# Patient Record
Sex: Female | Born: 1950 | Race: Asian | Hispanic: No | Marital: Married | Smoking: Never smoker
Health system: Southern US, Community
[De-identification: ages and names within clinical notes are randomized; demographics above are authoritative.]

## PROBLEM LIST (undated history)

## (undated) DIAGNOSIS — E119 Type 2 diabetes mellitus without complications: Secondary | ICD-10-CM

## (undated) DIAGNOSIS — E785 Hyperlipidemia, unspecified: Secondary | ICD-10-CM

## (undated) DIAGNOSIS — M1990 Unspecified osteoarthritis, unspecified site: Secondary | ICD-10-CM

## (undated) DIAGNOSIS — I10 Essential (primary) hypertension: Secondary | ICD-10-CM

## (undated) HISTORY — DX: Hyperlipidemia, unspecified: E78.5

## (undated) HISTORY — DX: Unspecified osteoarthritis, unspecified site: M19.90

---

## 2007-08-25 ENCOUNTER — Ambulatory Visit: Payer: Self-pay | Admitting: Internal Medicine

## 2007-10-06 ENCOUNTER — Encounter: Payer: Self-pay | Admitting: Family Medicine

## 2007-10-06 ENCOUNTER — Ambulatory Visit: Payer: Self-pay | Admitting: Internal Medicine

## 2007-10-06 LAB — CONVERTED CEMR LAB
ALT: 25 units/L (ref 0–35)
Alkaline Phosphatase: 94 units/L (ref 39–117)
Basophils Absolute: 0.1 10*3/uL (ref 0.0–0.1)
Basophils Relative: 1 % (ref 0–1)
CO2: 25 meq/L (ref 19–32)
Cholesterol: 198 mg/dL (ref 0–200)
Creatinine, Ser: 0.96 mg/dL (ref 0.40–1.20)
Eosinophils Absolute: 1.8 10*3/uL — ABNORMAL HIGH (ref 0.0–0.7)
Eosinophils Relative: 18 % — ABNORMAL HIGH (ref 0–5)
HCT: 40.2 % (ref 36.0–46.0)
Hemoglobin: 12.8 g/dL (ref 12.0–15.0)
Hepatitis B Surface Ag: NEGATIVE
LDL Cholesterol: 116 mg/dL — ABNORMAL HIGH (ref 0–99)
Lymphocytes Relative: 39 % (ref 12–46)
MCHC: 31.8 g/dL (ref 30.0–36.0)
MCV: 76.1 fL — ABNORMAL LOW (ref 78.0–100.0)
Monocytes Absolute: 0.4 10*3/uL (ref 0.1–1.0)
RDW: 15.2 % (ref 11.5–15.5)
Total Bilirubin: 0.4 mg/dL (ref 0.3–1.2)
Total CHOL/HDL Ratio: 5.4
VLDL: 45 mg/dL — ABNORMAL HIGH (ref 0–40)

## 2007-11-28 ENCOUNTER — Ambulatory Visit: Payer: Self-pay | Admitting: Internal Medicine

## 2008-08-13 ENCOUNTER — Ambulatory Visit: Payer: Self-pay | Admitting: Internal Medicine

## 2008-10-06 ENCOUNTER — Ambulatory Visit: Payer: Self-pay | Admitting: Internal Medicine

## 2008-10-06 ENCOUNTER — Encounter: Payer: Self-pay | Admitting: Family Medicine

## 2008-10-06 LAB — CONVERTED CEMR LAB
Albumin: 4.1 g/dL (ref 3.5–5.2)
Alkaline Phosphatase: 90 units/L (ref 39–117)
BUN: 16 mg/dL (ref 6–23)
Calcium: 9.6 mg/dL (ref 8.4–10.5)
Chloride: 105 meq/L (ref 96–112)
Glucose, Bld: 109 mg/dL — ABNORMAL HIGH (ref 70–99)
HDL: 33 mg/dL — ABNORMAL LOW (ref >39)
Potassium: 4.5 meq/L (ref 3.5–5.3)
Triglycerides: 177 mg/dL — ABNORMAL HIGH (ref <150)

## 2008-10-13 ENCOUNTER — Other Ambulatory Visit: Admission: RE | Admit: 2008-10-13 | Discharge: 2008-10-13 | Payer: Self-pay | Admitting: Internal Medicine

## 2008-10-13 ENCOUNTER — Ambulatory Visit: Payer: Self-pay | Admitting: Internal Medicine

## 2008-10-13 ENCOUNTER — Encounter: Payer: Self-pay | Admitting: Family Medicine

## 2008-10-14 ENCOUNTER — Ambulatory Visit (HOSPITAL_COMMUNITY): Admission: RE | Admit: 2008-10-14 | Discharge: 2008-10-14 | Payer: Self-pay | Admitting: Family Medicine

## 2008-12-28 ENCOUNTER — Ambulatory Visit: Payer: Self-pay | Admitting: Internal Medicine

## 2008-12-28 ENCOUNTER — Encounter: Payer: Self-pay | Admitting: Family Medicine

## 2008-12-28 LAB — CONVERTED CEMR LAB
Albumin: 4.3 g/dL (ref 3.5–5.2)
CO2: 21 meq/L (ref 19–32)
Cholesterol: 141 mg/dL (ref 0–200)
Glucose, Bld: 107 mg/dL — ABNORMAL HIGH (ref 70–99)
LDL Cholesterol: 76 mg/dL (ref 0–99)
Potassium: 4.9 meq/L (ref 3.5–5.3)
Sodium: 139 meq/L (ref 135–145)
Total Protein: 7.9 g/dL (ref 6.0–8.3)
Triglycerides: 119 mg/dL (ref <150)

## 2009-01-05 ENCOUNTER — Ambulatory Visit: Payer: Self-pay | Admitting: Internal Medicine

## 2016-02-17 ENCOUNTER — Ambulatory Visit (HOSPITAL_COMMUNITY)
Admission: EM | Admit: 2016-02-17 | Discharge: 2016-02-17 | Disposition: A | Discharge status: Home / Self Care | Payer: BLUE CROSS/BLUE SHIELD | Attending: Family Medicine | Admitting: Family Medicine

## 2016-02-17 ENCOUNTER — Encounter (HOSPITAL_COMMUNITY): Payer: Self-pay | Admitting: *Deleted

## 2016-02-17 DIAGNOSIS — I10 Essential (primary) hypertension: Secondary | ICD-10-CM | POA: Diagnosis not present

## 2016-02-17 DIAGNOSIS — Z760 Encounter for issue of repeat prescription: Secondary | ICD-10-CM

## 2016-02-17 HISTORY — DX: Essential (primary) hypertension: I10

## 2016-02-17 HISTORY — DX: Type 2 diabetes mellitus without complications: E11.9

## 2016-02-17 LAB — POCT I-STAT, CHEM 8
BUN: 24 mg/dL — AB (ref 6–20)
CALCIUM ION: 1.31 mmol/L (ref 1.15–1.40)
CHLORIDE: 105 mmol/L (ref 101–111)
Creatinine, Ser: 1.3 mg/dL — ABNORMAL HIGH (ref 0.44–1.00)
Glucose, Bld: 99 mg/dL (ref 65–99)
HEMATOCRIT: 36 % (ref 36.0–46.0)
Hemoglobin: 12.2 g/dL (ref 12.0–15.0)
Potassium: 4.3 mmol/L (ref 3.5–5.1)
SODIUM: 141 mmol/L (ref 135–145)
TCO2: 25 mmol/L (ref 0–100)

## 2016-02-17 MED ORDER — LISINOPRIL-HYDROCHLOROTHIAZIDE 20-25 MG PO TABS: 1.0000 | ORAL_TABLET | Freq: Every day | ORAL | 1 refills | Status: DC

## 2016-02-17 MED ORDER — LISINOPRIL-HYDROCHLOROTHIAZIDE 10-12.5 MG PO TABS: 1.0000 | ORAL_TABLET | Freq: Every day | ORAL | 1 refills | Status: DC

## 2016-02-17 NOTE — ED Provider Notes (Signed)
[facility]    CSN: [id number] Arrival date & time: 02/17/16  1130     History   Chief Complaint Chief Complaint  Patient presents with  . Medication Refill    HPI Julia Porter is a 65 y.o. female.   The history is provided by the patient and the spouse. The history is limited by a language barrier. A language interpreter was used (spouse).  Medication Refill  Reason for request:  Clinic/provider not available Medications taken before: yes - see home medications     Past Medical History:  Diagnosis Date  . Diabetes mellitus without complication (HCC)   . Hypertension     There are no active problems to display for this patient.   History reviewed. No pertinent surgical history.  OB History    No data available       Home Medications    Prior to Admission medications   Not on File    Family History History reviewed. No pertinent family history.  Social History Social History  Substance Use Topics  . Smoking status: Never Smoker  . Smokeless tobacco: Not on file  . Alcohol use Not on file     Allergies   Review of patient's allergies indicates no known allergies.   Review of Systems Review of Systems  Constitutional: Negative.   Neurological: Negative.   All other systems reviewed and are negative.    Physical Exam Triage Vital Signs ED Triage Vitals [02/17/16 1156]  Enc Vitals Group     BP 152/58     Pulse Rate 64     Resp 12     Temp 98.1 F (36.7 C)     Temp Source Oral     SpO2 99 %     Weight      Height      Head Circumference      Peak Flow      Pain Score      Pain Loc      Pain Edu?      Excl. in GC?    No data found.   Updated Vital Signs BP 152/58 (BP Location: Left Arm)   Pulse 64   Temp 98.1 F (36.7 C) (Oral)   Resp 12   SpO2 99%   Visual Acuity Right Eye Distance:   Left Eye Distance:   Bilateral Distance:    Right Eye Near:   Left Eye Near:    Bilateral Near:     Physical Exam    Constitutional: She is oriented to person, place, and time. She appears well-developed and well-nourished.  Musculoskeletal: She exhibits no edema.  Neurological: She is alert and oriented to person, place, and time.  Skin: Skin is warm and dry.  Nursing note and vitals reviewed.    UC Treatments / Results  Labs (all labs ordered are listed, but only abnormal results are displayed) Labs Reviewed - No data to display I-stat as noted.  EKG  EKG Interpretation None       Radiology No results found.  Procedures Procedures (including critical care time)  Medications Ordered in UC Medications - No data to display   Initial Impression / Assessment and Plan / UC Course  I have reviewed the triage vital signs and the nursing notes.  Pertinent labs & imaging results that were available during my care of the patient were reviewed by me and considered in my medical decision making (see chart for details).  Clinical Course  Final Clinical Impressions(s) / UC Diagnoses   Final diagnoses:  None    New Prescriptions New Prescriptions   No medications on file     Linna HoffJames D Afua Hoots, MD 02/17/16 1305

## 2016-02-17 NOTE — ED Triage Notes (Signed)
Pt  Needs  Refills  Of  Her  Medications  Her  pcp    Is  No   Longer    Available

## 2017-05-09 DIAGNOSIS — N289 Disorder of kidney and ureter, unspecified: Secondary | ICD-10-CM | POA: Diagnosis not present

## 2017-05-09 DIAGNOSIS — E785 Hyperlipidemia, unspecified: Secondary | ICD-10-CM | POA: Diagnosis not present

## 2017-05-09 DIAGNOSIS — E119 Type 2 diabetes mellitus without complications: Secondary | ICD-10-CM | POA: Diagnosis not present

## 2017-05-09 DIAGNOSIS — I10 Essential (primary) hypertension: Secondary | ICD-10-CM | POA: Diagnosis not present

## 2017-05-09 DIAGNOSIS — E559 Vitamin D deficiency, unspecified: Secondary | ICD-10-CM | POA: Diagnosis not present

## 2017-06-21 DIAGNOSIS — I10 Essential (primary) hypertension: Secondary | ICD-10-CM | POA: Diagnosis not present

## 2017-06-28 DIAGNOSIS — E785 Hyperlipidemia, unspecified: Secondary | ICD-10-CM | POA: Diagnosis not present

## 2017-06-28 DIAGNOSIS — E119 Type 2 diabetes mellitus without complications: Secondary | ICD-10-CM | POA: Diagnosis not present

## 2017-06-28 DIAGNOSIS — E559 Vitamin D deficiency, unspecified: Secondary | ICD-10-CM | POA: Diagnosis not present

## 2017-06-28 DIAGNOSIS — N289 Disorder of kidney and ureter, unspecified: Secondary | ICD-10-CM | POA: Diagnosis not present

## 2017-06-28 DIAGNOSIS — I10 Essential (primary) hypertension: Secondary | ICD-10-CM | POA: Diagnosis not present

## 2017-06-28 DIAGNOSIS — Z0000 Encounter for general adult medical examination without abnormal findings: Secondary | ICD-10-CM | POA: Diagnosis not present

## 2017-07-26 DIAGNOSIS — E785 Hyperlipidemia, unspecified: Secondary | ICD-10-CM | POA: Diagnosis not present

## 2017-07-26 DIAGNOSIS — E119 Type 2 diabetes mellitus without complications: Secondary | ICD-10-CM | POA: Diagnosis not present

## 2017-07-26 DIAGNOSIS — I10 Essential (primary) hypertension: Secondary | ICD-10-CM | POA: Diagnosis not present

## 2017-10-11 DIAGNOSIS — I10 Essential (primary) hypertension: Secondary | ICD-10-CM | POA: Diagnosis not present

## 2017-10-11 DIAGNOSIS — E785 Hyperlipidemia, unspecified: Secondary | ICD-10-CM | POA: Diagnosis not present

## 2017-10-11 DIAGNOSIS — E119 Type 2 diabetes mellitus without complications: Secondary | ICD-10-CM | POA: Diagnosis not present

## 2017-12-26 ENCOUNTER — Other Ambulatory Visit: Payer: Self-pay | Admitting: Physician Assistant

## 2017-12-26 DIAGNOSIS — Z1231 Encounter for screening mammogram for malignant neoplasm of breast: Secondary | ICD-10-CM

## 2018-04-11 DIAGNOSIS — Z23 Encounter for immunization: Secondary | ICD-10-CM | POA: Diagnosis not present

## 2018-04-11 DIAGNOSIS — E785 Hyperlipidemia, unspecified: Secondary | ICD-10-CM | POA: Diagnosis not present

## 2018-04-11 DIAGNOSIS — E119 Type 2 diabetes mellitus without complications: Secondary | ICD-10-CM | POA: Diagnosis not present

## 2018-04-11 DIAGNOSIS — I10 Essential (primary) hypertension: Secondary | ICD-10-CM | POA: Diagnosis not present

## 2018-05-30 DIAGNOSIS — E785 Hyperlipidemia, unspecified: Secondary | ICD-10-CM | POA: Diagnosis not present

## 2018-05-30 DIAGNOSIS — I10 Essential (primary) hypertension: Secondary | ICD-10-CM | POA: Diagnosis not present

## 2018-05-30 DIAGNOSIS — E119 Type 2 diabetes mellitus without complications: Secondary | ICD-10-CM | POA: Diagnosis not present

## 2018-05-30 DIAGNOSIS — R05 Cough: Secondary | ICD-10-CM | POA: Diagnosis not present

## 2018-07-04 DIAGNOSIS — E119 Type 2 diabetes mellitus without complications: Secondary | ICD-10-CM | POA: Diagnosis not present

## 2018-07-04 DIAGNOSIS — E785 Hyperlipidemia, unspecified: Secondary | ICD-10-CM | POA: Diagnosis not present

## 2018-07-04 DIAGNOSIS — I10 Essential (primary) hypertension: Secondary | ICD-10-CM | POA: Diagnosis not present

## 2018-07-04 DIAGNOSIS — Z0001 Encounter for general adult medical examination with abnormal findings: Secondary | ICD-10-CM | POA: Diagnosis not present

## 2018-10-10 DIAGNOSIS — E119 Type 2 diabetes mellitus without complications: Secondary | ICD-10-CM | POA: Diagnosis not present

## 2018-10-10 DIAGNOSIS — E785 Hyperlipidemia, unspecified: Secondary | ICD-10-CM | POA: Diagnosis not present

## 2018-10-10 DIAGNOSIS — I10 Essential (primary) hypertension: Secondary | ICD-10-CM | POA: Diagnosis not present

## 2018-12-01 ENCOUNTER — Other Ambulatory Visit: Payer: Self-pay

## 2018-12-01 DIAGNOSIS — Z20822 Contact with and (suspected) exposure to covid-19: Secondary | ICD-10-CM

## 2018-12-01 DIAGNOSIS — R6889 Other general symptoms and signs: Secondary | ICD-10-CM | POA: Diagnosis not present

## 2018-12-03 LAB — NOVEL CORONAVIRUS, NAA: SARS-CoV-2, NAA: NOT DETECTED

## 2018-12-20 ENCOUNTER — Inpatient Hospital Stay (HOSPITAL_COMMUNITY)
Admission: EM | Admit: 2018-12-20 | Discharge: 2018-12-24 | DRG: 177 | Disposition: A | Discharge status: Home / Self Care | Payer: Medicare HMO | Attending: Internal Medicine | Admitting: Internal Medicine

## 2018-12-20 ENCOUNTER — Emergency Department (HOSPITAL_COMMUNITY): Payer: Medicare HMO

## 2018-12-20 ENCOUNTER — Encounter (HOSPITAL_COMMUNITY): Payer: Self-pay | Admitting: Emergency Medicine

## 2018-12-20 ENCOUNTER — Other Ambulatory Visit: Payer: Self-pay

## 2018-12-20 DIAGNOSIS — E222 Syndrome of inappropriate secretion of antidiuretic hormone: Secondary | ICD-10-CM | POA: Diagnosis present

## 2018-12-20 DIAGNOSIS — R0602 Shortness of breath: Secondary | ICD-10-CM | POA: Diagnosis not present

## 2018-12-20 DIAGNOSIS — Z20828 Contact with and (suspected) exposure to other viral communicable diseases: Secondary | ICD-10-CM | POA: Diagnosis not present

## 2018-12-20 DIAGNOSIS — D72829 Elevated white blood cell count, unspecified: Secondary | ICD-10-CM

## 2018-12-20 DIAGNOSIS — R1013 Epigastric pain: Secondary | ICD-10-CM | POA: Diagnosis not present

## 2018-12-20 DIAGNOSIS — U071 COVID-19: Secondary | ICD-10-CM | POA: Diagnosis present

## 2018-12-20 DIAGNOSIS — E871 Hypo-osmolality and hyponatremia: Secondary | ICD-10-CM | POA: Diagnosis present

## 2018-12-20 DIAGNOSIS — R05 Cough: Secondary | ICD-10-CM | POA: Diagnosis not present

## 2018-12-20 DIAGNOSIS — E119 Type 2 diabetes mellitus without complications: Secondary | ICD-10-CM

## 2018-12-20 DIAGNOSIS — Z79899 Other long term (current) drug therapy: Secondary | ICD-10-CM

## 2018-12-20 DIAGNOSIS — Z20822 Contact with and (suspected) exposure to covid-19: Secondary | ICD-10-CM

## 2018-12-20 DIAGNOSIS — I10 Essential (primary) hypertension: Secondary | ICD-10-CM | POA: Diagnosis present

## 2018-12-20 DIAGNOSIS — J1289 Other viral pneumonia: Secondary | ICD-10-CM | POA: Diagnosis present

## 2018-12-20 DIAGNOSIS — D649 Anemia, unspecified: Secondary | ICD-10-CM

## 2018-12-20 DIAGNOSIS — R112 Nausea with vomiting, unspecified: Secondary | ICD-10-CM

## 2018-12-20 DIAGNOSIS — Z209 Contact with and (suspected) exposure to unspecified communicable disease: Secondary | ICD-10-CM | POA: Diagnosis not present

## 2018-12-20 DIAGNOSIS — E1165 Type 2 diabetes mellitus with hyperglycemia: Secondary | ICD-10-CM | POA: Diagnosis not present

## 2018-12-20 DIAGNOSIS — R079 Chest pain, unspecified: Secondary | ICD-10-CM | POA: Diagnosis not present

## 2018-12-20 DIAGNOSIS — J1282 Pneumonia due to coronavirus disease 2019: Secondary | ICD-10-CM

## 2018-12-20 MED ORDER — DEXAMETHASONE SODIUM PHOSPHATE 10 MG/ML IJ SOLN
10.0000 mg | Freq: Once | INTRAMUSCULAR | Status: AC
Start: 2018-12-20 — End: 2018-12-21
  Administered 2018-12-21: 01:00:00 10 mg via INTRAVENOUS
  Filled 2018-12-20: qty 1

## 2018-12-20 NOTE — ED Triage Notes (Signed)
Pt bib EMS and presents with SOB x 1 week.  Pt's husband was dx with COVID about a week ago.  Tonight pt feels that her breathing is worse.  EMS heard ronchi bilaterally in lungs. Hx: HTN, DM and cholesterol. Pt is noncompliant with medications. Pt only speaks [place].  EMS VS:  BP: 170/90, HR: 100, O2:100% RA, Temp 97.1 temporal (Pt took tylenol this evening.) CBG: 360

## 2018-12-20 NOTE — ED Provider Notes (Signed)
[facility] DEPT Provider Note   CSN: [id number] Arrival date & time: 12/20/18  2223    History   Chief Complaint Chief Complaint  Patient presents with  . Shortness of Breath    HPI Julia Porter is a 68 y.o. female.   The history is provided by the patient. A language interpreter was used.  Shortness of Breath She has history of hypertension and diabetes and comes in with 2-week history of progressive dyspnea.  Her husband and child have both been diagnosed with COVID-19.  She does have a cough productive of yellow sputum.  She denies fever or sweats but has had chills.  She states she has no sense of smell or taste.  There has been some vomiting but no diarrhea.  She is complaining of pain in her midsternal area to epigastric area.  She denies arthralgias or myalgias.  There has been no treatment at home.  Past Medical History:  Diagnosis Date  . Diabetes mellitus without complication ([place])   . Hypertension     There are no active problems to display for this patient.   No past surgical history on file.   OB History   No obstetric history on file.      Home Medications    Prior to Admission medications   Medication Sig Start Date End Date Taking? Authorizing Provider  lisinopril-hydrochlorothiazide (PRINZIDE,ZESTORETIC) 10-12.5 MG tablet Take 1 tablet by mouth daily. For blood pressure 02/17/16   Billy Fischer, MD    Family History No family history on file.  Social History Social History   Tobacco Use  . Smoking status: Never Smoker  Substance Use Topics  . Alcohol use: Not on file  . Drug use: Not on file     Allergies   Patient has no known allergies.   Review of Systems Review of Systems  Respiratory: Positive for shortness of breath.   All other systems reviewed and are negative.    Physical Exam Updated Vital Signs BP (!) 169/84   Pulse (!) 106   Temp 98.5 F (36.9 C) (Oral)   Resp 20   Ht 5\' 2"  (1.575 m)    Wt 59 kg   SpO2 97%   BMI 23.78 kg/m   Physical Exam Vitals signs and nursing note reviewed.    68 year old female, appears dyspneic at rest, but is in no acute distress. Vital signs are significant for elevated heart rate and blood pressure. Oxygen saturation is 97%, which is normal. Head is normocephalic and atraumatic. PERRLA, EOMI. Oropharynx is clear. Neck is nontender and supple without adenopathy or JVD. Back is nontender and there is no CVA tenderness. Lungs are clear without rales, wheezes, or rhonchi. Chest is nontender. Heart has regular rate and rhythm without murmur. Abdomen is soft, flat, nontender without masses or hepatosplenomegaly and peristalsis is normoactive. Extremities have no cyanosis or edema, full range of motion is present. Skin is warm and dry without rash. Neurologic: Mental status is normal, cranial nerves are intact, there are no motor or sensory deficits.  ED Treatments / Results  Labs (all labs ordered are listed, but only abnormal results are displayed) Labs Reviewed  COMPREHENSIVE METABOLIC PANEL - Abnormal; Notable for the following components:      Result Value   Sodium 116 (*)    Chloride 83 (*)    CO2 19 (*)    Glucose, Bld 235 (*)    Calcium 8.6 (*)    Albumin 3.4 (*)  ALT 50 (*)    All other components within normal limits  CBC WITH DIFFERENTIAL/PLATELET - Abnormal; Notable for the following components:   WBC 12.9 (*)    Hemoglobin 9.9 (*)    HCT 28.9 (*)    MCV 73.5 (*)    MCH 25.2 (*)    Platelets 434 (*)    Neutro Abs 10.1 (*)    Monocytes Absolute 1.2 (*)    Abs Immature Granulocytes 0.14 (*)    All other components within normal limits  URINALYSIS, ROUTINE W REFLEX MICROSCOPIC - Abnormal; Notable for the following components:   Color, Urine STRAW (*)    Glucose, UA >=500 (*)    Hgb urine dipstick SMALL (*)    All other components within normal limits  FERRITIN - Abnormal; Notable for the following components:    Ferritin 894 (*)    All other components within normal limits  LACTATE DEHYDROGENASE - Abnormal; Notable for the following components:   LDH 233 (*)    All other components within normal limits  TRIGLYCERIDES - Abnormal; Notable for the following components:   Triglycerides 233 (*)    All other components within normal limits  C-REACTIVE PROTEIN - Abnormal; Notable for the following components:   CRP 7.4 (*)    All other components within normal limits  SARS CORONAVIRUS 2 (HOSPITAL ORDER, PERFORMED IN [facility])  CULTURE, BLOOD (ROUTINE X 2)  CULTURE, BLOOD (ROUTINE X 2)  LACTIC ACID, PLASMA  PROCALCITONIN  BRAIN NATRIURETIC PEPTIDE  D-DIMER, QUANTITATIVE (NOT AT [facility])  PROTIME-INR  HIV ANTIBODY (ROUTINE TESTING W REFLEX)  FIBRINOGEN  ABO/RH  TROPONIN I (HIGH SENSITIVITY)  TROPONIN I (HIGH SENSITIVITY)    EKG EKG Interpretation  Date/Time:  Sunday December 21 2018 00:02:46 EDT Ventricular Rate:  89 PR Interval:    QRS Duration: 89 QT Interval:  377 QTC Calculation: 459 R Axis:   41 Text Interpretation:  Sinus rhythm Prolonged PR interval Abnormal R-wave progression, early transition Borderline T abnormalities, anterior leads No old tracing to compare Confirmed by Dione BoozeGlick, Gilmer Kaminsky ([id number]) on 12/21/2018 12:37:21 AM   Radiology Dg Chest Port 1 View  Result Date: 12/21/2018 CLINICAL DATA:  Cough and shortness of breath. COVID-19 exposure, husband diagnosed 2 weeks ago. EXAM: PORTABLE CHEST 1 VIEW COMPARISON:  None. FINDINGS: Heterogeneous bilateral lung opacities in a peripheral and mid to lower lung zone predominant distribution. The heart is normal in size with normal mediastinal contours. No pulmonary edema, large pleural effusion or pneumothorax. No acute osseous abnormalities. IMPRESSION: Heterogeneous bilateral lung opacities in a peripheral and mid to lower lung zone predominant distribution, highly suspicious for COVID-19 pneumonia. Radiographic involvement is  moderate. Electronically Signed   By: Narda RutherfordMelanie  Sanford M.D.   On: 12/21/2018 00:01    Procedures Procedures  CRITICAL CARE Performed by: Dione Boozeavid Viriginia Amendola Total critical care time: 45 minutes Critical care time was exclusive of separately billable procedures and treating other patients. Critical care was necessary to treat or prevent imminent or life-threatening deterioration. Critical care was time spent personally by me on the following activities: development of treatment plan with patient and/or surrogate as well as nursing, discussions with consultants, evaluation of patient's response to treatment, examination of patient, obtaining history from patient or surrogate, ordering and performing treatments and interventions, ordering and review of laboratory studies, ordering and review of radiographic studies, pulse oximetry and re-evaluation of patient's condition.  Medications Ordered in ED Medications  dexamethasone (DECADRON) injection 10 mg (10 mg Intravenous Given  12/21/18 0121)  ondansetron (ZOFRAN) injection 4 mg (4 mg Intravenous Given 12/21/18 0120)     Initial Impression / Assessment and Plan / ED Course  I have reviewed the triage vital signs and the nursing notes.  Pertinent labs & imaging results that were available during my care of the patient were reviewed by me and considered in my medical decision making (see chart for details).  Patient with exposure to COVID-19 and clinical presentation consistent with COVID-19.  Will check screening labs and x-ray.  Will give initial dose of dexamethasone.  Old records are reviewed, and she has no relevant past visits.  Chest x-ray seems consistent with COVID-19.  ECG is unremarkable.  Labs are significant for marked hyponatremia with sodium 116.  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  Curiously, COVID-19 swab was negative.  I feel this is a false negative since the rest of her presentation seems typical for COVID-19.  She is  maintaining adequate oxygen saturation on room air, but will need to be admitted for management of her hyponatremia.  Case is discussed with Dr. Erenest BlankIkram of [facility], who agrees to admit the patient.  Julia DrapeJa Clawson was evaluated in Emergency Department on 12/20/2018 for the symptoms described in the history of present illness. She was evaluated in the context of the global COVID-19 pandemic, which necessitated consideration that the patient might be at risk for infection with the SARS-CoV-2 virus that causes COVID-19. Institutional protocols and algorithms that pertain to the evaluation of patients at risk for COVID-19 are in a state of rapid change based on information released by regulatory bodies including the CDC and federal and state organizations. These policies and algorithms were followed during the patient's care in the ED.  Final Clinical Impressions(s) / ED Diagnoses   Final diagnoses:  Suspected Covid-19 Virus Infection  Hyponatremia  Shortness of breath  Non-intractable vomiting with nausea, unspecified vomiting type    ED Discharge Orders    None       Dione BoozeGlick, Bethzaida Boord, MD 12/21/18 [phone]

## 2018-12-21 DIAGNOSIS — E871 Hypo-osmolality and hyponatremia: Secondary | ICD-10-CM | POA: Diagnosis present

## 2018-12-21 DIAGNOSIS — J1289 Other viral pneumonia: Secondary | ICD-10-CM

## 2018-12-21 DIAGNOSIS — D649 Anemia, unspecified: Secondary | ICD-10-CM

## 2018-12-21 DIAGNOSIS — E222 Syndrome of inappropriate secretion of antidiuretic hormone: Secondary | ICD-10-CM

## 2018-12-21 DIAGNOSIS — I10 Essential (primary) hypertension: Secondary | ICD-10-CM | POA: Diagnosis present

## 2018-12-21 DIAGNOSIS — Z20828 Contact with and (suspected) exposure to other viral communicable diseases: Secondary | ICD-10-CM | POA: Diagnosis not present

## 2018-12-21 DIAGNOSIS — D72829 Elevated white blood cell count, unspecified: Secondary | ICD-10-CM

## 2018-12-21 DIAGNOSIS — U071 COVID-19: Secondary | ICD-10-CM | POA: Diagnosis present

## 2018-12-21 DIAGNOSIS — E119 Type 2 diabetes mellitus without complications: Secondary | ICD-10-CM

## 2018-12-21 DIAGNOSIS — Z79899 Other long term (current) drug therapy: Secondary | ICD-10-CM | POA: Diagnosis not present

## 2018-12-21 DIAGNOSIS — J1282 Pneumonia due to coronavirus disease 2019: Secondary | ICD-10-CM

## 2018-12-21 LAB — CBC WITH DIFFERENTIAL/PLATELET
Abs Immature Granulocytes: 0.14 10*3/uL — ABNORMAL HIGH (ref 0.00–0.07)
Basophils Absolute: 0 10*3/uL (ref 0.0–0.1)
Basophils Relative: 0 %
Eosinophils Absolute: 0 10*3/uL (ref 0.0–0.5)
Eosinophils Relative: 0 %
HCT: 28.9 % — ABNORMAL LOW (ref 36.0–46.0)
Hemoglobin: 9.9 g/dL — ABNORMAL LOW (ref 12.0–15.0)
Immature Granulocytes: 1 %
Lymphocytes Relative: 10 %
Lymphs Abs: 1.3 10*3/uL (ref 0.7–4.0)
MCH: 25.2 pg — ABNORMAL LOW (ref 26.0–34.0)
MCHC: 34.3 g/dL (ref 30.0–36.0)
MCV: 73.5 fL — ABNORMAL LOW (ref 80.0–100.0)
Monocytes Absolute: 1.2 10*3/uL — ABNORMAL HIGH (ref 0.1–1.0)
Monocytes Relative: 10 %
Neutro Abs: 10.1 10*3/uL — ABNORMAL HIGH (ref 1.7–7.7)
Neutrophils Relative %: 79 %
Platelets: 434 10*3/uL — ABNORMAL HIGH (ref 150–400)
RBC: 3.93 MIL/uL (ref 3.87–5.11)
RDW: 12.5 % (ref 11.5–15.5)
WBC: 12.9 10*3/uL — ABNORMAL HIGH (ref 4.0–10.5)
nRBC: 0 % (ref 0.0–0.2)

## 2018-12-21 LAB — TRIGLYCERIDES: Triglycerides: 233 mg/dL — ABNORMAL HIGH (ref <150)

## 2018-12-21 LAB — BASIC METABOLIC PANEL
Anion gap: 8 (ref 5–15)
Anion gap: 14 (ref 5–15)
Anion gap: 10 (ref 5–15)
Anion gap: 11 (ref 5–15)
BUN: 11 mg/dL (ref 8–23)
BUN: 15 mg/dL (ref 8–23)
BUN: 16 mg/dL (ref 8–23)
BUN: 17 mg/dL (ref 8–23)
CO2: 16 mmol/L — ABNORMAL LOW (ref 22–32)
CO2: 17 mmol/L — ABNORMAL LOW (ref 22–32)
CO2: 19 mmol/L — ABNORMAL LOW (ref 22–32)
CO2: 19 mmol/L — ABNORMAL LOW (ref 22–32)
Calcium: 6.8 mg/dL — ABNORMAL LOW (ref 8.9–10.3)
Calcium: 8.4 mg/dL — ABNORMAL LOW (ref 8.9–10.3)
Calcium: 8.6 mg/dL — ABNORMAL LOW (ref 8.9–10.3)
Calcium: 8.5 mg/dL — ABNORMAL LOW (ref 8.9–10.3)
Chloride: 106 mmol/L (ref 98–111)
Chloride: 98 mmol/L (ref 98–111)
Chloride: 99 mmol/L (ref 98–111)
Chloride: 98 mmol/L (ref 98–111)
Creatinine, Ser: 0.79 mg/dL (ref 0.44–1.00)
Creatinine, Ser: 0.99 mg/dL (ref 0.44–1.00)
Creatinine, Ser: 1.09 mg/dL — ABNORMAL HIGH (ref 0.44–1.00)
Creatinine, Ser: 1.06 mg/dL — ABNORMAL HIGH (ref 0.44–1.00)
GFR calc Af Amer: >60 mL/min (ref >60)
GFR calc Af Amer: >60 mL/min (ref >60)
GFR calc Af Amer: >60 mL/min (ref >60)
GFR calc Af Amer: >60 mL/min (ref >60)
GFR calc non Af Amer: >60 mL/min (ref >60)
GFR calc non Af Amer: 59 mL/min — ABNORMAL LOW (ref >60)
GFR calc non Af Amer: 52 mL/min — ABNORMAL LOW (ref >60)
GFR calc non Af Amer: 54 mL/min — ABNORMAL LOW (ref >60)
Glucose, Bld: 259 mg/dL — ABNORMAL HIGH (ref 70–99)
Glucose, Bld: 307 mg/dL — ABNORMAL HIGH (ref 70–99)
Glucose, Bld: 218 mg/dL — ABNORMAL HIGH (ref 70–99)
Glucose, Bld: 194 mg/dL — ABNORMAL HIGH (ref 70–99)
Potassium: 4 mmol/L (ref 3.5–5.1)
Potassium: 4.5 mmol/L (ref 3.5–5.1)
Potassium: 4.6 mmol/L (ref 3.5–5.1)
Potassium: 4.4 mmol/L (ref 3.5–5.1)
Sodium: 130 mmol/L — ABNORMAL LOW (ref 135–145)
Sodium: 129 mmol/L — ABNORMAL LOW (ref 135–145)
Sodium: 128 mmol/L — ABNORMAL LOW (ref 135–145)
Sodium: 128 mmol/L — ABNORMAL LOW (ref 135–145)

## 2018-12-21 LAB — ABO/RH: ABO/RH(D): B POS

## 2018-12-21 LAB — FERRITIN
Ferritin: 894 ng/mL — ABNORMAL HIGH (ref 11–307)
Ferritin: 771 ng/mL — ABNORMAL HIGH (ref 11–307)

## 2018-12-21 LAB — CBC
HCT: 29 % — ABNORMAL LOW (ref 36.0–46.0)
Hemoglobin: 9.7 g/dL — ABNORMAL LOW (ref 12.0–15.0)
MCH: 24.9 pg — ABNORMAL LOW (ref 26.0–34.0)
MCHC: 33.4 g/dL (ref 30.0–36.0)
MCV: 74.6 fL — ABNORMAL LOW (ref 80.0–100.0)
Platelets: 426 10*3/uL — ABNORMAL HIGH (ref 150–400)
RBC: 3.89 MIL/uL (ref 3.87–5.11)
RDW: 12.5 % (ref 11.5–15.5)
WBC: 10.1 10*3/uL (ref 4.0–10.5)
nRBC: 0 % (ref 0.0–0.2)

## 2018-12-21 LAB — COMPREHENSIVE METABOLIC PANEL
ALT: 50 U/L — ABNORMAL HIGH (ref 0–44)
AST: 40 U/L (ref 15–41)
Albumin: 3.4 g/dL — ABNORMAL LOW (ref 3.5–5.0)
Alkaline Phosphatase: 85 U/L (ref 38–126)
Anion gap: 14 (ref 5–15)
BUN: 14 mg/dL (ref 8–23)
CO2: 19 mmol/L — ABNORMAL LOW (ref 22–32)
Calcium: 8.6 mg/dL — ABNORMAL LOW (ref 8.9–10.3)
Chloride: 83 mmol/L — ABNORMAL LOW (ref 98–111)
Creatinine, Ser: 0.79 mg/dL (ref 0.44–1.00)
GFR calc Af Amer: >60 mL/min (ref >60)
GFR calc non Af Amer: >60 mL/min (ref >60)
Glucose, Bld: 235 mg/dL — ABNORMAL HIGH (ref 70–99)
Potassium: 4.3 mmol/L (ref 3.5–5.1)
Sodium: 116 mmol/L — CL (ref 135–145)
Total Bilirubin: 0.6 mg/dL (ref 0.3–1.2)
Total Protein: 7.4 g/dL (ref 6.5–8.1)

## 2018-12-21 LAB — BRAIN NATRIURETIC PEPTIDE: B Natriuretic Peptide: 35.9 pg/mL (ref 0.0–100.0)

## 2018-12-21 LAB — URINALYSIS, ROUTINE W REFLEX MICROSCOPIC
Bacteria, UA: NONE SEEN
Bilirubin Urine: NEGATIVE
Glucose, UA: >=500 mg/dL — AB
Ketones, ur: NEGATIVE mg/dL
Leukocytes,Ua: NEGATIVE
Nitrite: NEGATIVE
Protein, ur: NEGATIVE mg/dL
Specific Gravity, Urine: 1.005 (ref 1.005–1.030)
pH: 6 (ref 5.0–8.0)

## 2018-12-21 LAB — HIV ANTIBODY (ROUTINE TESTING W REFLEX): HIV Screen 4th Generation wRfx: NONREACTIVE

## 2018-12-21 LAB — C-REACTIVE PROTEIN
CRP: 7.4 mg/dL — ABNORMAL HIGH (ref <1.0)
CRP: 8 mg/dL — ABNORMAL HIGH (ref <1.0)

## 2018-12-21 LAB — HEMOGLOBIN A1C
Hgb A1c MFr Bld: 8.7 % — ABNORMAL HIGH (ref 4.8–5.6)
Mean Plasma Glucose: 202.99 mg/dL

## 2018-12-21 LAB — SODIUM, URINE, RANDOM: Sodium, Ur: 24 mmol/L

## 2018-12-21 LAB — D-DIMER, QUANTITATIVE
D-Dimer, Quant: 1.23 ug/mL-FEU — ABNORMAL HIGH (ref 0.00–0.50)
D-Dimer, Quant: 1.62 ug/mL-FEU — ABNORMAL HIGH (ref 0.00–0.50)

## 2018-12-21 LAB — SARS CORONAVIRUS 2 BY RT PCR (HOSPITAL ORDER, PERFORMED IN ~~LOC~~ HOSPITAL LAB): SARS Coronavirus 2: NEGATIVE

## 2018-12-21 LAB — TROPONIN I (HIGH SENSITIVITY)
Troponin I (High Sensitivity): 17 ng/L (ref <18)
Troponin I (High Sensitivity): 9 ng/L (ref <18)

## 2018-12-21 LAB — GLUCOSE, CAPILLARY
Glucose-Capillary: 205 mg/dL — ABNORMAL HIGH (ref 70–99)
Glucose-Capillary: 201 mg/dL — ABNORMAL HIGH (ref 70–99)

## 2018-12-21 LAB — PROTIME-INR
INR: 0.9 (ref 0.8–1.2)
Prothrombin Time: 12.2 seconds (ref 11.4–15.2)

## 2018-12-21 LAB — OSMOLALITY, URINE: Osmolality, Ur: 218 mOsm/kg — ABNORMAL LOW (ref 300–900)

## 2018-12-21 LAB — LACTATE DEHYDROGENASE: LDH: 233 U/L — ABNORMAL HIGH (ref 98–192)

## 2018-12-21 LAB — CBG MONITORING, ED
Glucose-Capillary: 276 mg/dL — ABNORMAL HIGH (ref 70–99)
Glucose-Capillary: 303 mg/dL — ABNORMAL HIGH (ref 70–99)

## 2018-12-21 LAB — LACTIC ACID, PLASMA: Lactic Acid, Venous: 1.8 mmol/L (ref 0.5–1.9)

## 2018-12-21 LAB — FIBRINOGEN: Fibrinogen: 635 mg/dL — ABNORMAL HIGH (ref 210–475)

## 2018-12-21 LAB — PROCALCITONIN: Procalcitonin: <0.1 ng/mL

## 2018-12-21 MED ORDER — DEXAMETHASONE SODIUM PHOSPHATE 10 MG/ML IJ SOLN
6.0000 mg | Freq: Every day | INTRAMUSCULAR | Status: DC
  Administered 2018-12-21 – 2018-12-24 (×4): 6 mg via INTRAVENOUS
  Filled 2018-12-21: qty 1
  Filled 2018-12-21 (×3): qty 0.6

## 2018-12-21 MED ORDER — ONDANSETRON HCL 4 MG/2ML IJ SOLN
4.0000 mg | Freq: Four times a day (QID) | INTRAMUSCULAR | Status: DC | PRN
  Administered 2018-12-21: 4 mg via INTRAVENOUS
  Filled 2018-12-21: qty 2

## 2018-12-21 MED ORDER — ONDANSETRON HCL 4 MG/2ML IJ SOLN
4.0000 mg | Freq: Once | INTRAMUSCULAR | Status: AC
  Administered 2018-12-21: 4 mg via INTRAVENOUS
  Filled 2018-12-21: qty 2

## 2018-12-21 MED ORDER — ZINC SULFATE 220 (50 ZN) MG PO CAPS
220.0000 mg | ORAL_CAPSULE | Freq: Every day | ORAL | Status: DC
  Administered 2018-12-21 – 2018-12-24 (×4): 220 mg via ORAL
  Filled 2018-12-21 (×3): qty 1

## 2018-12-21 MED ORDER — ENOXAPARIN SODIUM 40 MG/0.4ML ~~LOC~~ SOLN
40.0000 mg | Freq: Every day | SUBCUTANEOUS | Status: DC
  Administered 2018-12-22 – 2018-12-24 (×3): 40 mg via SUBCUTANEOUS
  Filled 2018-12-21 (×4): qty 0.4

## 2018-12-21 MED ORDER — ONDANSETRON HCL 4 MG PO TABS: 4.0000 mg | ORAL_TABLET | Freq: Four times a day (QID) | ORAL | Status: DC | PRN

## 2018-12-21 MED ORDER — ACETAMINOPHEN 325 MG PO TABS
650.0000 mg | ORAL_TABLET | Freq: Four times a day (QID) | ORAL | Status: DC | PRN
  Administered 2018-12-21: 650 mg via ORAL
  Filled 2018-12-21 (×2): qty 2

## 2018-12-21 MED ORDER — INSULIN ASPART 100 UNIT/ML ~~LOC~~ SOLN
0.0000 [IU] | Freq: Every day | SUBCUTANEOUS | Status: DC
  Administered 2018-12-21 – 2018-12-23 (×2): 2 [IU] via SUBCUTANEOUS
  Filled 2018-12-21: qty 0.05

## 2018-12-21 MED ORDER — LISINOPRIL-HYDROCHLOROTHIAZIDE 10-12.5 MG PO TABS: 1.0000 | ORAL_TABLET | Freq: Every day | ORAL | Status: DC

## 2018-12-21 MED ORDER — DOCUSATE SODIUM 100 MG PO CAPS
100.0000 mg | ORAL_CAPSULE | Freq: Two times a day (BID) | ORAL | Status: DC
  Administered 2018-12-21 – 2018-12-24 (×7): 100 mg via ORAL
  Filled 2018-12-21 (×7): qty 1

## 2018-12-21 MED ORDER — SODIUM CHLORIDE 0.9 % IV SOLN
2.0000 g | INTRAVENOUS | Status: DC
  Administered 2018-12-21 – 2018-12-24 (×4): 2 g via INTRAVENOUS
  Filled 2018-12-21 (×2): qty 2
  Filled 2018-12-21: qty 20
  Filled 2018-12-21: qty 2

## 2018-12-21 MED ORDER — AZITHROMYCIN 250 MG PO TABS
500.0000 mg | ORAL_TABLET | Freq: Every day | ORAL | Status: DC
  Administered 2018-12-21 – 2018-12-24 (×4): 500 mg via ORAL
  Filled 2018-12-21 (×4): qty 2

## 2018-12-21 MED ORDER — INSULIN ASPART 100 UNIT/ML ~~LOC~~ SOLN
0.0000 [IU] | Freq: Three times a day (TID) | SUBCUTANEOUS | Status: DC
  Administered 2018-12-21: 8 [IU] via SUBCUTANEOUS
  Administered 2018-12-21: 11 [IU] via SUBCUTANEOUS
  Administered 2018-12-21: 5 [IU] via SUBCUTANEOUS
  Administered 2018-12-22: 3 [IU] via SUBCUTANEOUS
  Administered 2018-12-22: 5 [IU] via SUBCUTANEOUS
  Administered 2018-12-22: 8 [IU] via SUBCUTANEOUS
  Administered 2018-12-23: 09:00:00 3 [IU] via SUBCUTANEOUS
  Administered 2018-12-23 (×2): 8 [IU] via SUBCUTANEOUS
  Administered 2018-12-24: 2 [IU] via SUBCUTANEOUS
  Filled 2018-12-21: qty 0.15

## 2018-12-21 MED ORDER — SODIUM CHLORIDE 0.9 % IV SOLN
INTRAVENOUS | Status: DC
  Administered 2018-12-21: 18:00:00 via INTRAVENOUS
  Administered 2018-12-21: 75 mL/h via INTRAVENOUS
  Administered 2018-12-22 – 2018-12-23 (×2): via INTRAVENOUS

## 2018-12-21 MED ORDER — VITAMIN C 500 MG PO TABS
500.0000 mg | ORAL_TABLET | Freq: Every day | ORAL | Status: DC
  Administered 2018-12-21 – 2018-12-24 (×4): 500 mg via ORAL
  Filled 2018-12-21 (×4): qty 1

## 2018-12-21 MED ORDER — LISINOPRIL 10 MG PO TABS
10.0000 mg | ORAL_TABLET | Freq: Every day | ORAL | Status: DC
  Administered 2018-12-21 – 2018-12-24 (×4): 10 mg via ORAL
  Filled 2018-12-21 (×5): qty 1

## 2018-12-21 MED ORDER — ACETAMINOPHEN 650 MG RE SUPP: 650.0000 mg | Freq: Four times a day (QID) | RECTAL | Status: DC | PRN

## 2018-12-21 MED ORDER — HYDROCHLOROTHIAZIDE 12.5 MG PO CAPS
12.5000 mg | ORAL_CAPSULE | Freq: Every day | ORAL | Status: DC
  Administered 2018-12-21 – 2018-12-22 (×2): 12.5 mg via ORAL
  Filled 2018-12-21 (×3): qty 1

## 2018-12-21 NOTE — Progress Notes (Addendum)
[facility].  PROGRESS NOTE    Julia Porter Leckrone  WUJ:[id number]:[id number] DOB: 08-Apr-1951 DOA: 12/20/2018 PCP: Patient, No Pcp Per   Brief Narrative:   Julia Porter is a 68 y.o. female with medical history significant for DM, HTN admitted with hyponatremia and suspected COVID19 infection. History is taken from medical record and discussion with RN as due to technical difficulties I was unable to get an interpreter. Apparently she has family members at home suffering from COVID, and for the last 2 weeks she has had progressive dyspnea, anosmia, cough productive of yellow sputum. She has been afebrile and has no treatments at home.     Assessment & Plan:   Active Problems:   Hyponatremia   Pneumonia due to COVID-19 virus   SIADH (syndrome of inappropriate ADH production) (HCC)   Anemia   Leukocytosis   DM type 2 (diabetes mellitus, type 2) (HCC)   ?Covid 19 PNA vs CAP      - she is on room air in no distress     - exposure to COVID at home (husband/son); with 2 weeks of progressively worsening respiratory symptoms as well at lack of smell/taste      - right now she is on RA with sats in the high 90's     - started on IV decadron, vitamin C, Zinc sulfate     - started on rocephin, zithro     - CXR w/ findings concernign for COVID19     - negative OP COVID test on 12/01/2018; negative NP COVID test 12/20/2018; concern by admitting physician that this is a false negative     - let's see today's inflammatory markers     - her story and situation fits too well for COVID, d/w [facility] MD, will order send out lab, pt to remain PUI  Hyponatremia     - fluid restrict to 1800 cc     - normal saline hydration     - follow Q6 BMP until Na improved     - improving (Na+ 130), monitor  DM2      - not on any medications at home     - diabetic diet     - SSI while on IV steroids  HTN     - lisinopril/HCTZ  DVT prophylaxis: lovenox Code Status: FULL Family Communication: None at bedside   Disposition Plan: TBD  Antimicrobials:   . Rocephin, zithromycin   ROS:  Reports weakness, fatigue. Reports dyspnea improving. Denies CP, HA, N, V. Remainder 10-pt ROS is negative for all not previously mentioned.  Subjective: "I'm ok."  [Interview through interpreter by phone]  Objective: Vitals:   12/21/18 1200 12/21/18 1230 12/21/18 1300 12/21/18 1330  BP: (!) 175/88 (!) 149/77 140/74 (!) 156/85  Pulse: (!) 107 91 88 (!) 125  Resp: (!) 26 (!) 24 (!) 23 (!) 25  Temp:      TempSrc:      SpO2: 96% 97% 97% 97%  Weight:      Height:       No intake or output data in the 24 hours ending 12/21/18 1342 Filed Weights   12/21/18 0001  Weight: 59 kg    Examination:  General: 68 y.o. female resting in bed in NAD Eyes: PERRL, normal sclera ENMT: Nares patent w/o discharge, orophaynx clear, dentition normal, ears w/o discharge/lesions/ulcers Cardiovascular: tachy, +S1, S2, no m/g/r, equal pulses throughout Respiratory: rhonchi at bases, normal WOB GI: BS+, NDNT, no masses noted, no organomegaly noted MSK: No e/c/c  Skin: No rashes, bruises, ulcerations noted Neuro: Alert to name, follows commands, no focal deficits   Data Reviewed: I have personally reviewed following labs and imaging studies.  CBC: Recent Labs  Lab 12/20/18 2358 12/21/18 0656  WBC 12.9* 10.1  NEUTROABS 10.1*  --   HGB 9.9* 9.7*  HCT 28.9* 29.0*  MCV 73.5* 74.6*  PLT 434* 426*   Basic Metabolic Panel: Recent Labs  Lab 12/20/18 2358 12/21/18 0656  NA 116* 130*  K 4.3 4.0  CL 83* 106  CO2 19* 16*  GLUCOSE 235* 259*  BUN 14 11  CREATININE 0.79 0.79  CALCIUM 8.6* 6.8*   GFR: Estimated Creatinine Clearance: 54 mL/min (by C-G formula based on SCr of 0.79 mg/dL). Liver Function Tests: Recent Labs  Lab 12/20/18 2358  AST 40  ALT 50*  ALKPHOS 85  BILITOT 0.6  PROT 7.4  ALBUMIN 3.4*   No results for input(s): LIPASE, AMYLASE in the last 168 hours. No results for input(s): AMMONIA in the last 168 hours. Coagulation Profile:  Recent Labs  Lab 12/20/18 2358  INR 0.9   Cardiac Enzymes: No results for input(s): CKTOTAL, CKMB, CKMBINDEX, TROPONINI in the last 168 hours. BNP (last 3 results) No results for input(s): PROBNP in the last 8760 hours. HbA1C: Recent Labs    12/20/18 2350  HGBA1C 8.7*   CBG: Recent Labs  Lab 12/21/18 0805 12/21/18 1322  GLUCAP 276* 303*   Lipid Profile: Recent Labs    12/20/18 2359  TRIG 233*   Thyroid Function Tests: No results for input(s): TSH, T4TOTAL, FREET4, T3FREE, THYROIDAB in the last 72 hours. Anemia Panel: Recent Labs    12/20/18 2359  FERRITIN 894*   Sepsis Labs: Recent Labs  Lab 12/20/18 2358 12/20/18 2359  PROCALCITON <0.10  --   LATICACIDVEN  --  1.8    Recent Results (from the past 240 hour(s))  SARS Coronavirus 2 [facility](Hospital order, Performed in [facility]) Nasopharyngeal Nasopharyngeal Swab     Status: None   Collection Time: 12/20/18 11:59 PM   Specimen: Nasopharyngeal Swab  Result Value Ref Range Status   SARS Coronavirus 2 NEGATIVE NEGATIVE Final    Comment: (NOTE) If result is NEGATIVE SARS-CoV-2 target nucleic acids are NOT DETECTED. The SARS-CoV-2 RNA is generally detectable in upper and lower  respiratory specimens during the acute phase of infection. The lowest  concentration of SARS-CoV-2 viral copies this assay can detect is 250  copies / mL. A negative result does not preclude SARS-CoV-2 infection  and should not be used as the sole basis for treatment or other  patient management decisions.  A negative result may occur with  improper specimen collection / handling, submission of specimen other  than nasopharyngeal swab, presence of viral mutation(s) within the  areas targeted by this assay, and inadequate number of viral copies  (<250 copies / mL). A negative result must be combined with clinical  observations, patient history, and epidemiological information. If result is POSITIVE SARS-CoV-2 target nucleic  acids are DETECTED. The SARS-CoV-2 RNA is generally detectable in upper and lower  respiratory specimens dur ing the acute phase of infection.  Positive  results are indicative of active infection with SARS-CoV-2.  Clinical  correlation with patient history and other diagnostic information is  necessary to determine patient infection status.  Positive results do  not rule out bacterial infection or co-infection with other viruses. If result is PRESUMPTIVE POSTIVE SARS-CoV-2 nucleic acids MAY BE PRESENT.   A presumptive  positive result was obtained on the submitted specimen  and confirmed on repeat testing.  While 2019 novel coronavirus  (SARS-CoV-2) nucleic acids may be present in the submitted sample  additional confirmatory testing may be necessary for epidemiological  and / or clinical management purposes  to differentiate between  SARS-CoV-2 and other Sarbecovirus currently known to infect humans.  If clinically indicated additional testing with an alternate test  methodology [id number]) is advised. The SARS-CoV-2 RNA is generally  detectable in upper and lower respiratory sp ecimens during the acute  phase of infection. The expected result is Negative. Fact Sheet for Patients:  BoilerBrush.com.cyhttps://www.fda.gov/media/136312/download Fact Sheet for Healthcare Providers: https://pope.com/https://www.fda.gov/media/136313/download This test is not yet approved or cleared by the [place] FDA and has been authorized for detection and/or diagnosis of SARS-CoV-2 by FDA under an Emergency Use Authorization (EUA).  This EUA will remain in effect (meaning this test can be used) for the duration of the COVID-19 declaration under Section 564(b)(1) of the Act, 21 U.S.C. section 360bbb-3(b)(1), unless the authorization is terminated or revoked sooner. Performed at [facility], [address] [place], [place] [address]   Culture, blood (routine x 2)     Status: None (Preliminary result)    Collection Time: 12/20/18 11:59 PM   Specimen: BLOOD  Result Value Ref Range Status   Specimen Description   Final    BLOOD LEFT ANTECUBITAL Performed at [facility], [address] [place], [place] [address]    Special Requests   Final    BOTTLES DRAWN AEROBIC AND ANAEROBIC Blood Culture results may not be optimal due to an excessive volume of blood received in culture bottles Performed at [facility], [address] [place], [place] [address]    Culture   Final    NO GROWTH < 12 HOURS Performed at [facility], [address] [place], [place] [address]    Report Status PENDING  Incomplete  Culture, blood (routine x 2)     Status: None (Preliminary result)   Collection Time: 12/21/18 12:00 AM   Specimen: BLOOD LEFT HAND  Result Value Ref Range Status   Specimen Description   Final    BLOOD LEFT HAND Performed at [facility], [address] [place], [place] [address]    Special Requests   Final    BOTTLES DRAWN AEROBIC AND ANAEROBIC Blood Culture results may not be optimal due to an excessive volume of blood received in culture bottles Performed at [facility], [address] [place], [place] [address]    Culture   Final    NO GROWTH < 12 HOURS Performed at [facility], [address] [place], [place] [address]    Report Status PENDING  Incomplete      Radiology Studies: Dg Chest Port 1 View  Result Date: 12/21/2018 CLINICAL DATA:  Cough and shortness of breath. COVID-19 exposure, husband diagnosed 2 weeks ago. EXAM: PORTABLE CHEST 1 VIEW COMPARISON:  None. FINDINGS: Heterogeneous bilateral lung opacities in a peripheral and mid to lower lung zone predominant distribution. The heart is normal in size with normal mediastinal contours. No pulmonary edema, large pleural effusion or pneumothorax. No acute osseous abnormalities. IMPRESSION: Heterogeneous bilateral lung opacities in a  peripheral and mid to lower lung zone predominant distribution, highly suspicious for COVID-19 pneumonia. Radiographic involvement is moderate. Electronically Signed   By: Narda RutherfordMelanie  Sanford M.D.   On: 12/21/2018 00:01  Scheduled Meds: . azithromycin  500 mg Oral Daily  . dexamethasone (DECADRON) injection  6 mg Intravenous Daily  . docusate sodium  100 mg Oral BID  . enoxaparin (LOVENOX) injection  40 mg Subcutaneous Daily  . insulin aspart  0-15 Units Subcutaneous TID WC  . insulin aspart  0-5 Units Subcutaneous QHS  . lisinopril-hydrochlorothiazide  1 tablet Oral Daily  . vitamin C  500 mg Oral Daily  . zinc sulfate  220 mg Oral Daily   Continuous Infusions: . sodium chloride 75 mL/hr (12/21/18 0647)  . cefTRIAXone (ROCEPHIN)  IV 2 g (12/21/18 1107)     LOS: 0 days    Time spent: 35 minutes spent in the coordination of care today.   Jonnie Finner, DO [facility] Pager [phone]  If 7PM-7AM, please contact night-coverage [email or web address] Password [email or web address] 12/21/2018, 1:42 PM

## 2018-12-21 NOTE — ED Notes (Signed)
Attempted to call report was informed room was not cleaned yet and to call back in 30 mins.

## 2018-12-21 NOTE — ED Notes (Signed)
Urine culture sent for save.

## 2018-12-21 NOTE — ED Notes (Signed)
Date and time results received: 12/21/18 0109  Test: Sodium Critical Value:116 Name of Provider Notified: Dr. Roxanne Mins  Orders Received? Or Actions Taken?: See orders/continue to monitor

## 2018-12-21 NOTE — ED Notes (Signed)
Patient states she is in contact with a family member that is positive for covid. Patient states she feels weak. She states she is having sob with O2 Sat-98% on room air. Patient is not labored.

## 2018-12-21 NOTE — ED Notes (Signed)
ED TO INPATIENT HANDOFF REPORT  Name/Age/Gender Julia Porter 68 y.o. female  Code Status    Code Status Orders  (From admission, onward)         Start     Ordered   12/21/18 0208  Full code  Continuous     12/21/18 0208        Code Status History    This patient has a current code status but no historical code status.   Advance Care Planning Activity      Home/SNF/Other Home  Chief Complaint SOB  Level of Care/Admitting Diagnosis ED Disposition    ED Disposition Condition Comment   Admit  [facility] [[id number]]  Level of Care: Med-Surg [16]  Covid Evaluation: Person Under Investigation (PUI)  Diagnosis: Hyponatremia [409811][[id number]]  Admitting Physician: Teena DunkIKRAMULLAH, MIR [facility] [[id number]]  Attending Physician: Kirby CriglerIKRAMULLAH, MIR [facility] [[id number]]  Estimated length of stay: past midnight tomorrow  Certification:: I certify this patient will need inpatient services for at least 2 midnights  PT Class (Do Not Modify): Inpatient [101]  PT Acc Code (Do Not Modify): Private [1]       Medical History Past Medical History:  Diagnosis Date  . Diabetes mellitus without complication (HCC)   . Hypertension     Allergies No Known Allergies  IV Location/Drains/Wounds Patient Lines/Drains/Airways Status   Active Line/Drains/Airways    Name:   Placement date:   Placement time:   Site:   Days:   Peripheral IV 12/20/18 Left Antecubital   12/20/18    2340    Antecubital   1   Peripheral IV 12/21/18 Left Hand   12/21/18    0001    Hand   less than 1          Labs/Imaging Results for orders placed or performed during the hospital encounter of 12/20/18 (from the past 48 hour(s))  Troponin I (High Sensitivity)     Status: None   Collection Time: 12/20/18 11:46 PM  Result Value Ref Range   Troponin I (High Sensitivity) 17 <18 ng/L    Comment: (NOTE) Elevated high sensitivity troponin I (hsTnI) values and significant  changes across serial  measurements may suggest ACS but many other  chronic and acute conditions are known to elevate hsTnI results.  Refer to the "Links" section for chest pain algorithms and additional  guidance. Performed at [facility], [address] [place], [place] [address]   Sodium, urine, random     Status: None   Collection Time: 12/20/18 11:50 PM  Result Value Ref Range   Sodium, Ur 24 mmol/L    Comment: Performed at [facility], [address] [place], [place] [address]  Osmolality, urine     Status: Abnormal   Collection Time: 12/20/18 11:50 PM  Result Value Ref Range   Osmolality, Ur 218 (L) 300 - 900 mOsm/kg    Comment: Performed at [facility], [address] [place], [place] [address]  Hemoglobin A1c     Status: Abnormal   Collection Time: 12/20/18 11:50 PM  Result Value Ref Range   Hgb A1c MFr Bld 8.7 (H) 4.8 - 5.6 %    Comment: (NOTE) Pre diabetes:          5.7%-6.4% Diabetes:              >6.4% Glycemic control for   <7.0% adults with diabetes    Mean Plasma Glucose 202.99 mg/dL    Comment:  Performed at [facility], [address] [place], [place] [address]  Comprehensive metabolic panel     Status: Abnormal   Collection Time: 12/20/18 11:58 PM  Result Value Ref Range   Sodium 116 (LL) 135 - 145 mmol/L    Comment: CRITICAL RESULT CALLED TO, READ BACK BY AND VERIFIED WITH: DOSTER,T AT 0107 ON 12/21/2018 BY MOSLEY,J    Potassium 4.3 3.5 - 5.1 mmol/L   Chloride 83 (L) 98 - 111 mmol/L   CO2 19 (L) 22 - 32 mmol/L   Glucose, Bld 235 (H) 70 - 99 mg/dL   BUN 14 8 - 23 mg/dL   Creatinine, Ser 6.04 0.44 - 1.00 mg/dL   Calcium 8.6 (L) 8.9 - 10.3 mg/dL   Total Protein 7.4 6.5 - 8.1 g/dL   Albumin 3.4 (L) 3.5 - 5.0 g/dL   AST 40 15 - 41 U/L   ALT 50 (H) 0 - 44 U/L   Alkaline Phosphatase 85 38 - 126 U/L   Total Bilirubin 0.6 0.3 - 1.2 mg/dL   GFR calc non Af Amer >60 >60 mL/min   GFR calc Af Amer >60 >60 mL/min   Anion gap 14  5 - 15    Comment: Performed at [facility], [address] [place], [place] [address]  CBC with Differential     Status: Abnormal   Collection Time: 12/20/18 11:58 PM  Result Value Ref Range   WBC 12.9 (H) 4.0 - 10.5 K/uL   RBC 3.93 3.87 - 5.11 MIL/uL   Hemoglobin 9.9 (L) 12.0 - 15.0 g/dL   HCT 11.9 (L) 14.7 - 82.9 %   MCV 73.5 (L) 80.0 - 100.0 fL   MCH 25.2 (L) 26.0 - 34.0 pg   MCHC 34.3 30.0 - 36.0 g/dL   RDW 56.2 13.0 - 86.5 %   Platelets 434 (H) 150 - 400 K/uL   nRBC 0.0 0.0 - 0.2 %   Neutrophils Relative % 79 %   Neutro Abs 10.1 (H) 1.7 - 7.7 K/uL   Lymphocytes Relative 10 %   Lymphs Abs 1.3 0.7 - 4.0 K/uL   Monocytes Relative 10 %   Monocytes Absolute 1.2 (H) 0.1 - 1.0 K/uL   Eosinophils Relative 0 %   Eosinophils Absolute 0.0 0.0 - 0.5 K/uL   Basophils Relative 0 %   Basophils Absolute 0.0 0.0 - 0.1 K/uL   Immature Granulocytes 1 %   Abs Immature Granulocytes 0.14 (H) 0.00 - 0.07 K/uL    Comment: Performed at [facility], [address], [place], [place] [address]  D-dimer, quantitative     Status: Abnormal   Collection Time: 12/20/18 11:58 PM  Result Value Ref Range   D-Dimer, Quant 1.23 (H) 0.00 - 0.50 ug/mL-FEU    Comment: (NOTE) At the manufacturer cut-off of 0.50 ug/mL FEU, this assay has been documented to exclude PE with a sensitivity and negative predictive value of 97 to 99%.  At this time, this assay has not been approved by the FDA to exclude DVT/VTE. Results should be correlated with clinical presentation. Performed at [facility], [address], [place], [place] [address]   Protime-INR     Status: None   Collection Time: 12/20/18 11:58 PM  Result Value Ref Range   Prothrombin Time 12.2 11.4 - 15.2 seconds   INR 0.9 0.8 - 1.2    Comment: (NOTE) INR goal varies based on device and disease states. Performed at [facility], [place] [address], [place], [place] [address]    Urinalysis, Routine w reflex microscopic     Status: Abnormal   Collection Time: 12/20/18 11:58 PM  Result Value Ref Range   Color, Urine STRAW (A) YELLOW   APPearance CLEAR CLEAR   Specific Gravity, Urine 1.005 1.005 - 1.030   pH 6.0 5.0 - 8.0   Glucose, UA >=500 (A) NEGATIVE mg/dL   Hgb urine dipstick SMALL (A) NEGATIVE   Bilirubin Urine NEGATIVE NEGATIVE   Ketones, ur NEGATIVE NEGATIVE mg/dL   Protein, ur NEGATIVE NEGATIVE mg/dL   Nitrite NEGATIVE NEGATIVE   Leukocytes,Ua NEGATIVE NEGATIVE   WBC, UA 0-5 0 - 5 WBC/hpf   Bacteria, UA NONE SEEN NONE SEEN    Comment: Performed at [facility], [place] [address] [place], [place] [address]  HIV antibody (Routine Testing)     Status: None   Collection Time: 12/20/18 11:58 PM  Result Value Ref Range   HIV Screen 4th Generation wRfx Non Reactive Non Reactive    Comment: (NOTE) Performed At: [facility] [address] [place], [place] [id number] Rush Farmer MD VE:[id number]   Fibrinogen     Status: Abnormal   Collection Time: 12/20/18 11:58 PM  Result Value Ref Range   Fibrinogen 635 (H) 210 - 475 mg/dL    Comment: Performed at [facility], [place] [address] [place], [place] [address]  Procalcitonin     Status: None   Collection Time: 12/20/18 11:58 PM  Result Value Ref Range   Procalcitonin <0.10 ng/mL    Comment:        Interpretation: PCT (Procalcitonin) <= 0.5 ng/mL: Systemic infection (sepsis) is not likely. Local bacterial infection is possible. (NOTE)       Sepsis PCT Algorithm           Lower Respiratory Tract                                      Infection PCT Algorithm    ----------------------------     ----------------------------         PCT < 0.25 ng/mL                PCT < 0.10 ng/mL         Strongly encourage             Strongly discourage   discontinuation of antibiotics    initiation of antibiotics    ----------------------------      -----------------------------       PCT 0.25 - 0.50 ng/mL            PCT 0.10 - 0.25 ng/mL               OR       >80% decrease in PCT            Discourage initiation of                                            antibiotics      Encourage discontinuation           of antibiotics    ----------------------------     -----------------------------         PCT >= 0.50 ng/mL  PCT 0.26 - 0.50 ng/mL               AND        <80% decrease in PCT             Encourage initiation of                                             antibiotics       Encourage continuation           of antibiotics    ----------------------------     -----------------------------        PCT >= 0.50 ng/mL                  PCT > 0.50 ng/mL               AND         increase in PCT                  Strongly encourage                                      initiation of antibiotics    Strongly encourage escalation           of antibiotics                                     -----------------------------                                           PCT <= 0.25 ng/mL                                                 OR                                        > 80% decrease in PCT                                     Discontinue / Do not initiate                                             antibiotics Performed at [facility], [address], [place], [place] [address]   Lactate dehydrogenase     Status: Abnormal   Collection Time: 12/20/18 11:58 PM  Result Value Ref Range   LDH 233 (H) 98 - 192 U/L    Comment: Performed at [facility], [address] [place], [place] [address]  Lactic acid, plasma     Status: None   Collection Time: 12/20/18 11:59 PM  Result Value Ref Range  Lactic Acid, Venous 1.8 0.5 - 1.9 mmol/L    Comment: Performed at [facility], [address] [place], [place] [address]  SARS Coronavirus 2 [facility] order, Performed in [facility] hospital lab) Nasopharyngeal Nasopharyngeal Swab     Status: None   Collection Time: 12/20/18 11:59 PM   Specimen: Nasopharyngeal Swab  Result Value Ref Range   SARS Coronavirus 2 NEGATIVE NEGATIVE    Comment: (NOTE) If result is NEGATIVE SARS-CoV-2 target nucleic acids are NOT DETECTED. The SARS-CoV-2 RNA is generally detectable in upper and lower  respiratory specimens during the acute phase of infection. The lowest  concentration of SARS-CoV-2 viral copies this assay can detect is 250  copies / mL. A negative result does not preclude SARS-CoV-2 infection  and should not be used as the sole basis for treatment or other  patient management decisions.  A negative result may occur with  improper specimen collection / handling, submission of specimen other  than nasopharyngeal swab, presence of viral mutation(s) within the  areas targeted by this assay, and inadequate number of viral copies  (<250 copies / mL). A negative result must be combined with clinical  observations, patient history, and epidemiological information. If result is POSITIVE SARS-CoV-2 target nucleic acids are DETECTED. The SARS-CoV-2 RNA is generally detectable in upper and lower  respiratory specimens dur ing the acute phase of infection.  Positive  results are indicative of active infection with SARS-CoV-2.  Clinical  correlation with patient history and other diagnostic information is  necessary to determine patient infection status.  Positive results do  not rule out bacterial infection or co-infection with other viruses. If result is PRESUMPTIVE POSTIVE SARS-CoV-2 nucleic acids MAY BE PRESENT.   A presumptive positive result was obtained on the submitted specimen  and confirmed on repeat testing.  While 2019 novel coronavirus  (SARS-CoV-2) nucleic acids may be present in the submitted sample  additional confirmatory testing may be necessary for epidemiological  and / or clinical management purposes  to  differentiate between  SARS-CoV-2 and other Sarbecovirus currently known to infect humans.  If clinically indicated additional testing with an alternate test  methodology [phone]) is advised. The SARS-CoV-2 RNA is generally  detectable in upper and lower respiratory sp ecimens during the acute  phase of infection. The expected result is Negative. Fact Sheet for Patients:  BoilerBrush.com.cy Fact Sheet for Healthcare Providers: https://pope.com/ This test is not yet approved or cleared by the [place] FDA and has been authorized for detection and/or diagnosis of SARS-CoV-2 by FDA under an Emergency Use Authorization (EUA).  This EUA will remain in effect (meaning this test can be used) for the duration of the COVID-19 declaration under Section 564(b)(1) of the Act, 21 U.S.C. section 360bbb-3(b)(1), unless the authorization is terminated or revoked sooner. Performed at [facility], [address] [place], [place] [address]   ABO/Rh     Status: None   Collection Time: 12/20/18 11:59 PM  Result Value Ref Range   ABO/RH(D)      B POS Performed at [facility], [address], [place], [place] [address]   Ferritin     Status: Abnormal   Collection Time: 12/20/18 11:59 PM  Result Value Ref Range   Ferritin 894 (H) 11 - 307 ng/mL    Comment: Performed at [facility], [address], [place], [place] [address]  Triglycerides     Status: Abnormal   Collection Time: 12/20/18 11:59  PM  Result Value Ref Range   Triglycerides 233 (H) <150 mg/dL    Comment: Performed at [facility], [address] [place], [place] [address]  Brain natriuretic peptide     Status: None   Collection Time: 12/20/18 11:59 PM  Result Value Ref Range   B Natriuretic Peptide 35.9 0.0 - 100.0 pg/mL    Comment: Performed at [facility], [address]  [place], [place] [address]  C-reactive protein     Status: Abnormal   Collection Time: 12/20/18 11:59 PM  Result Value Ref Range   CRP 7.4 (H) <1.0 mg/dL    Comment: Performed at [facility], [address] [place], [place] [address]  Culture, blood (routine x 2)     Status: None (Preliminary result)   Collection Time: 12/20/18 11:59 PM   Specimen: BLOOD  Result Value Ref Range   Specimen Description      BLOOD LEFT ANTECUBITAL Performed at [facility], [address] [place], [place] [address]    Special Requests      BOTTLES DRAWN AEROBIC AND ANAEROBIC Blood Culture results may not be optimal due to an excessive volume of blood received in culture bottles Performed at [facility], [address] [place], [place] [address]    Culture      NO GROWTH < 12 HOURS Performed at [facility], [address] [place], [place] [address]    Report Status PENDING   Culture, blood (routine x 2)     Status: None (Preliminary result)   Collection Time: 12/21/18 12:00 AM   Specimen: BLOOD LEFT HAND  Result Value Ref Range   Specimen Description      BLOOD LEFT HAND Performed at [facility], [address] [place], [place] [address]    Special Requests      BOTTLES DRAWN AEROBIC AND ANAEROBIC Blood Culture results may not be optimal due to an excessive volume of blood received in culture bottles Performed at [facility], [address] [place], [place] [address]    Culture      NO GROWTH < 12 HOURS Performed at [facility], [address] [place], [place] [address]    Report Status PENDING   CBC     Status: Abnormal   Collection Time: 12/21/18  6:56 AM  Result Value Ref Range   WBC 10.1 4.0 - 10.5 K/uL   RBC 3.89 3.87 - 5.11 MIL/uL   Hemoglobin 9.7 (L) 12.0 - 15.0 g/dL   HCT 95.129.0 (L) 88.436.0 - 16.646.0 %   MCV 74.6 (L) 80.0 - 100.0 fL   MCH 24.9 (L) 26.0 - 34.0 pg   MCHC 33.4  30.0 - 36.0 g/dL   RDW 06.312.5 01.611.5 - 01.015.5 %   Platelets 426 (H) 150 - 400 K/uL   nRBC 0.0 0.0 - 0.2 %    Comment: Performed at [facility], [address] [place], [place] [address]  Basic metabolic panel     Status: Abnormal   Collection Time: 12/21/18  6:56 AM  Result Value Ref Range   Sodium 130 (L) 135 - 145 mmol/L    Comment: DELTA CHECK NOTED   Potassium 4.0 3.5 - 5.1 mmol/L   Chloride 106 98 - 111 mmol/L   CO2 16 (L) 22 - 32 mmol/L   Glucose, Bld 259 (H) 70 - 99 mg/dL   BUN 11 8 -  23 mg/dL   Creatinine, Ser 4.090.79 0.44 - 1.00 mg/dL   Calcium 6.8 (L) 8.9 - 10.3 mg/dL   GFR calc non Af Amer >60 >60 mL/min   GFR calc Af Amer >60 >60 mL/min   Anion gap 8 5 - 15    Comment: Performed at [facility], [address] [place], [place] [address]  CBG monitoring, ED     Status: Abnormal   Collection Time: 12/21/18  8:05 AM  Result Value Ref Range   Glucose-Capillary 276 (H) 70 - 99 mg/dL  Basic metabolic panel     Status: Abnormal   Collection Time: 12/21/18  1:22 PM  Result Value Ref Range   Sodium 129 (L) 135 - 145 mmol/L   Potassium 4.5 3.5 - 5.1 mmol/L   Chloride 98 98 - 111 mmol/L   CO2 17 (L) 22 - 32 mmol/L   Glucose, Bld 307 (H) 70 - 99 mg/dL   BUN 15 8 - 23 mg/dL   Creatinine, Ser 4.780.99 0.44 - 1.00 mg/dL   Calcium 8.4 (L) 8.9 - 10.3 mg/dL   GFR calc non Af Amer 59 (L) >60 mL/min   GFR calc Af Amer >60 >60 mL/min   Anion gap 14 5 - 15    Comment: Performed at [facility], [address] [place], [place] [address]  CBG monitoring, ED     Status: Abnormal   Collection Time: 12/21/18  1:22 PM  Result Value Ref Range   Glucose-Capillary 303 (H) 70 - 99 mg/dL  D-dimer, quantitative (not at [facility])     Status: Abnormal   Collection Time: 12/21/18  1:32 PM  Result Value Ref Range   D-Dimer, Quant 1.62 (H) 0.00 - 0.50 ug/mL-FEU    Comment: (NOTE) At the manufacturer cut-off of 0.50 ug/mL FEU, this assay has  been documented to exclude PE with a sensitivity and negative predictive value of 97 to 99%.  At this time, this assay has not been approved by the FDA to exclude DVT/VTE. Results should be correlated with clinical presentation. Performed at [facility], [address] [place], [place] [address]    Dg Chest Port 1 View  Result Date: 12/21/2018 CLINICAL DATA:  Cough and shortness of breath. COVID-19 exposure, husband diagnosed 2 weeks ago. EXAM: PORTABLE CHEST 1 VIEW COMPARISON:  None. FINDINGS: Heterogeneous bilateral lung opacities in a peripheral and mid to lower lung zone predominant distribution. The heart is normal in size with normal mediastinal contours. No pulmonary edema, large pleural effusion or pneumothorax. No acute osseous abnormalities. IMPRESSION: Heterogeneous bilateral lung opacities in a peripheral and mid to lower lung zone predominant distribution, highly suspicious for COVID-19 pneumonia. Radiographic involvement is moderate. Electronically Signed   By: Narda RutherfordMelanie  Sanford M.D.   On: 12/21/2018 00:01    Pending Labs Unresulted Labs (From admission, onward)    Start     Ordered   12/22/18 0500  CBC with Differential/Platelet  Tomorrow morning,   R     12/21/18 1359   12/22/18 0500  Magnesium  Tomorrow morning,   R     12/21/18 1359   12/22/18 0500  Ferritin  Tomorrow morning,   R     12/21/18 1359   12/22/18 0500  C-reactive protein  Tomorrow morning,   R     12/21/18 1359   12/22/18 0500  D-dimer, quantitative (not at [facility])  Tomorrow morning,   R     12/21/18 1359  12/21/18 1427  Novel Coronavirus, NAA (hospital order; send-out to ref lab)  (Symptomatic/High Risk of Exposure/Tier 1 Patients Labs with Precautions)  Once,   STAT    Question Answer Comment  Is this test for diagnosis or screening Diagnosis of ill patient   Symptomatic for COVID-19 as defined by CDC Yes   Date of Symptom Onset 12/01/2018   Hospitalized for COVID-19 Yes   Admitted to  ICU for COVID-19 No   Previously tested for COVID-19 Yes   Resident in a congregate (group) care setting No   Employed in healthcare setting No   Pregnant No      12/21/18 1426   12/21/18 1142  C-reactive protein  ONCE - STAT,   STAT     12/21/18 1141   12/21/18 1142  Ferritin  ONCE - STAT,   STAT     12/21/18 1141   12/21/18 0400  Basic metabolic panel  Now then every 6 hours,   R (with STAT occurrences)     12/21/18 0210          Vitals/Pain Today's Vitals   12/21/18 1508 12/21/18 1530 12/21/18 1600 12/21/18 1630  BP: 138/75 (!) 149/75 132/87 133/68  Pulse:  91 82 79  Resp:  (!) 23 19 15   Temp:      TempSrc:      SpO2:  99% 98% 97%  Weight:      Height:      PainSc:        Isolation Precautions Airborne and Contact precautions  Medications Medications  dexamethasone (DECADRON) injection 6 mg (6 mg Intravenous Given 12/21/18 1106)  zinc sulfate capsule 220 mg (220 mg Oral Given 12/21/18 1323)  vitamin C (ASCORBIC ACID) tablet 500 mg (500 mg Oral Given 12/21/18 1323)  enoxaparin (LOVENOX) injection 40 mg (has no administration in time range)  acetaminophen (TYLENOL) tablet 650 mg (650 mg Oral Given 12/21/18 0644)    Or  acetaminophen (TYLENOL) suppository 650 mg ( Rectal See Alternative 12/21/18 0644)  docusate sodium (COLACE) capsule 100 mg (100 mg Oral Given 12/21/18 1107)  ondansetron (ZOFRAN) tablet 4 mg ( Oral See Alternative 12/21/18 0646)    Or  ondansetron (ZOFRAN) injection 4 mg (4 mg Intravenous Given 12/21/18 0646)  0.9 %  sodium chloride infusion (75 mL/hr Intravenous New Bag/Given 12/21/18 0647)  insulin aspart (novoLOG) injection 0-15 Units (11 Units Subcutaneous Given 12/21/18 1330)  insulin aspart (novoLOG) injection 0-5 Units (has no administration in time range)  cefTRIAXone (ROCEPHIN) 2 g in sodium chloride 0.9 % 100 mL IVPB (2 g Intravenous New Bag/Given 12/21/18 1107)  azithromycin (ZITHROMAX) tablet 500 mg (500 mg Oral Given 12/21/18 1107)  lisinopril  (ZESTRIL) tablet 10 mg (10 mg Oral Given 12/21/18 1508)    And  hydrochlorothiazide (MICROZIDE) capsule 12.5 mg (12.5 mg Oral Given 12/21/18 1508)  dexamethasone (DECADRON) injection 10 mg (10 mg Intravenous Given 12/21/18 0121)  ondansetron (ZOFRAN) injection 4 mg (4 mg Intravenous Given 12/21/18 0120)    Mobility walks

## 2018-12-21 NOTE — ED Notes (Addendum)
[place] is interpreter with language resources. Pt speaks [place]. Tried [place] and [employer] and pt does not understand.

## 2018-12-21 NOTE — H&P (Signed)
History and Physical  Julia DrapeJa Abundis YNW:[id number] DOB: 06-Jan-1951 DOA: 12/20/2018   PCP: Patient, No Pcp Per   Patient coming from: Home   Chief Complaint: Shortness of breath   HPI: Julia Porter is a 68 y.o. female with medical history significant for DM, HTN admitted with hyponatremia and suspected COVID19 infection. History is taken from medical record and discussion with RN as due to technical difficulties I was unable to get an interpreter. Apparently she has family members at home suffering from COVID, and for the last 2 weeks she has had progressive dyspnea, anosmia, cough productive of yellow sputum. She has been afebrile and has no treatments at home.    ED Course: In the ER, she was given IV steroids and CXR was noted to be abnormal. She also has elevated inflammatory markers which have been associated with COVID.  Review of Systems: Please see HPI for pertinent positives and negatives. A complete 10 system review of systems are otherwise negative.  Past Medical History:  Diagnosis Date   Diabetes mellitus without complication (HCC)    Hypertension    History reviewed. No pertinent surgical history.  Social History:  reports that she has never smoked. She does not have any smokeless tobacco history on file. No history on file for alcohol and drug.   No Known Allergies  History reviewed. No pertinent family history.   Prior to Admission medications   Medication Sig Start Date End Date Taking? Authorizing Provider  lisinopril-hydrochlorothiazide (PRINZIDE,ZESTORETIC) 10-12.5 MG tablet Take 1 tablet by mouth daily. For blood pressure 02/17/16   Linna HoffKindl, James D, MD    Physical Exam: BP (!) 169/84    Pulse (!) 106    Temp 98.5 F (36.9 C) (Oral)    Resp 20    Ht 5\' 2"  (1.575 m)    Wt 59 kg    SpO2 97%    BMI 23.78 kg/m   General:  Alert, calm, in no acute distress  Eyes: EOMI, clear conjuctivae, white sclerea Cardiovascular: RRR, no murmurs or rubs, no peripheral edema    Respiratory: minimal rhonchi bilaterally, no wheezes, no crackles  Abdomen: soft, nontender, nondistended, normal bowel tones heard  Skin: dry, no rashes  Musculoskeletal: no joint effusions, normal range of motion  Psychiatric: appropriate affect Neurologic: extraocular muscles intact, clear speech, moving all extremities with intact sensorium          Labs on Admission:  Basic Metabolic Panel: Recent Labs  Lab 12/20/18 2358  NA 116*  K 4.3  CL 83*  CO2 19*  GLUCOSE 235*  BUN 14  CREATININE 0.79  CALCIUM 8.6*   Liver Function Tests: Recent Labs  Lab 12/20/18 2358  AST 40  ALT 50*  ALKPHOS 85  BILITOT 0.6  PROT 7.4  ALBUMIN 3.4*   No results for input(s): LIPASE, AMYLASE in the last 168 hours. No results for input(s): AMMONIA in the last 168 hours. CBC: Recent Labs  Lab 12/20/18 2358  WBC 12.9*  NEUTROABS 10.1*  HGB 9.9*  HCT 28.9*  MCV 73.5*  PLT 434*   Cardiac Enzymes: No results for input(s): CKTOTAL, CKMB, CKMBINDEX, TROPONINI in the last 168 hours.  BNP (last 3 results) Recent Labs    12/20/18 2359  BNP 35.9    ProBNP (last 3 results) No results for input(s): PROBNP in the last 8760 hours.  CBG: No results for input(s): GLUCAP in the last 168 hours.  Radiological Exams on Admission: Dg Chest Port 1 View  Result Date:  12/21/2018 CLINICAL DATA:  Cough and shortness of breath. COVID-19 exposure, husband diagnosed 2 weeks ago. EXAM: PORTABLE CHEST 1 VIEW COMPARISON:  None. FINDINGS: Heterogeneous bilateral lung opacities in a peripheral and mid to lower lung zone predominant distribution. The heart is normal in size with normal mediastinal contours. No pulmonary edema, large pleural effusion or pneumothorax. No acute osseous abnormalities. IMPRESSION: Heterogeneous bilateral lung opacities in a peripheral and mid to lower lung zone predominant distribution, highly suspicious for COVID-19 pneumonia. Radiographic involvement is moderate.  Electronically Signed   By: Keith Rake M.D.   On: 12/21/2018 00:01    Assessment/Plan Present on Admission:  Hyponatremia  Active Problems:   Hyponatremia   Pneumonia due to COVID-19 virus   SIADH (syndrome of inappropriate ADH production) (HCC)   Anemia   Leukocytosis   DM type 2 (diabetes mellitus, type 39) ([place])  68 year old [place] speaking woman with history of HTN and DM2 admitted with hyponatremia and suspected COVID 19 PNA.  Covid 19 PNA - she is on room air in no distress. Despite negative test I have a very strong suspicion of COVID, she may be negative due to having had the disease for a couple weeks. - inpatient admission - supplemental O2 as neeed - supportive care - IV decadron 6mg  daily - vitamin C - Zinc sulfate - could consider Remdesivir if not improving - follow inflammatory markers  Hyponatremia - I suspect combination of dehdyration and SIADH due to her pulmonary illness - fluid restrict to 1800 cc - normal saline hydration - follow Q6 BMP until Na improved - check Urine Na and Osm  DM2 - not on any medications at home - diabetic diet - SSI while on IV steroids  DVT prophylaxis: Lovenox   Code Status: FULL   Family Communication: None   Disposition Plan: Home at [place] called: None   Admission status: Inpatient   Time spent: 42 minutes  Aubryana Vittorio Marry Guan MD [facility] Pager [phone]  If 7PM-7AM, please contact night-coverage [email or web address] Password [email or web address]  12/21/2018, 2:12 AM

## 2018-12-22 ENCOUNTER — Encounter (HOSPITAL_COMMUNITY): Payer: Self-pay | Admitting: *Deleted

## 2018-12-22 DIAGNOSIS — E871 Hypo-osmolality and hyponatremia: Secondary | ICD-10-CM

## 2018-12-22 DIAGNOSIS — E119 Type 2 diabetes mellitus without complications: Secondary | ICD-10-CM

## 2018-12-22 LAB — CBC WITH DIFFERENTIAL/PLATELET
Abs Immature Granulocytes: 0.31 10*3/uL — ABNORMAL HIGH (ref 0.00–0.07)
Basophils Absolute: 0 10*3/uL (ref 0.0–0.1)
Basophils Relative: 0 %
Eosinophils Absolute: 0 10*3/uL (ref 0.0–0.5)
Eosinophils Relative: 0 %
HCT: 27.3 % — ABNORMAL LOW (ref 36.0–46.0)
Hemoglobin: 8.9 g/dL — ABNORMAL LOW (ref 12.0–15.0)
Immature Granulocytes: 2 %
Lymphocytes Relative: 11 %
Lymphs Abs: 1.5 10*3/uL (ref 0.7–4.0)
MCH: 24.7 pg — ABNORMAL LOW (ref 26.0–34.0)
MCHC: 32.6 g/dL (ref 30.0–36.0)
MCV: 75.8 fL — ABNORMAL LOW (ref 80.0–100.0)
Monocytes Absolute: 1.5 10*3/uL — ABNORMAL HIGH (ref 0.1–1.0)
Monocytes Relative: 12 %
Neutro Abs: 9.9 10*3/uL — ABNORMAL HIGH (ref 1.7–7.7)
Neutrophils Relative %: 75 %
Platelets: 441 10*3/uL — ABNORMAL HIGH (ref 150–400)
RBC: 3.6 MIL/uL — ABNORMAL LOW (ref 3.87–5.11)
RDW: 12.9 % (ref 11.5–15.5)
WBC: 13.2 10*3/uL — ABNORMAL HIGH (ref 4.0–10.5)
nRBC: 0 % (ref 0.0–0.2)

## 2018-12-22 LAB — BASIC METABOLIC PANEL
Anion gap: 9 (ref 5–15)
BUN: 17 mg/dL (ref 8–23)
CO2: 19 mmol/L — ABNORMAL LOW (ref 22–32)
Calcium: 8.5 mg/dL — ABNORMAL LOW (ref 8.9–10.3)
Chloride: 105 mmol/L (ref 98–111)
Creatinine, Ser: 1.06 mg/dL — ABNORMAL HIGH (ref 0.44–1.00)
GFR calc Af Amer: >60 mL/min (ref >60)
GFR calc non Af Amer: 54 mL/min — ABNORMAL LOW (ref >60)
Glucose, Bld: 123 mg/dL — ABNORMAL HIGH (ref 70–99)
Potassium: 4.3 mmol/L (ref 3.5–5.1)
Sodium: 133 mmol/L — ABNORMAL LOW (ref 135–145)

## 2018-12-22 LAB — MAGNESIUM: Magnesium: 2.2 mg/dL (ref 1.7–2.4)

## 2018-12-22 LAB — GLUCOSE, CAPILLARY
Glucose-Capillary: 168 mg/dL — ABNORMAL HIGH (ref 70–99)
Glucose-Capillary: 227 mg/dL — ABNORMAL HIGH (ref 70–99)
Glucose-Capillary: 268 mg/dL — ABNORMAL HIGH (ref 70–99)
Glucose-Capillary: 154 mg/dL — ABNORMAL HIGH (ref 70–99)

## 2018-12-22 LAB — FERRITIN: Ferritin: 637 ng/mL — ABNORMAL HIGH (ref 11–307)

## 2018-12-22 LAB — C-REACTIVE PROTEIN: CRP: 3.9 mg/dL — ABNORMAL HIGH (ref <1.0)

## 2018-12-22 LAB — NOVEL CORONAVIRUS, NAA (HOSP ORDER, SEND-OUT TO REF LAB; TAT 18-24 HRS): SARS-CoV-2, NAA: NOT DETECTED

## 2018-12-22 LAB — D-DIMER, QUANTITATIVE: D-Dimer, Quant: 2.95 ug/mL-FEU — ABNORMAL HIGH (ref 0.00–0.50)

## 2018-12-22 MED ORDER — AMLODIPINE BESYLATE 5 MG PO TABS
5.0000 mg | ORAL_TABLET | Freq: Every day | ORAL | Status: DC
  Administered 2018-12-22 – 2018-12-24 (×3): 5 mg via ORAL
  Filled 2018-12-22 (×3): qty 1

## 2018-12-22 NOTE — Plan of Care (Signed)
  Problem: Education: Goal: Knowledge of General Education information will improve Description: Including pain rating scale, medication(s)/side effects and non-pharmacologic comfort measures Outcome: Progressing   Problem: Clinical Measurements: Goal: Ability to maintain clinical measurements within normal limits will improve Outcome: Progressing Goal: Will remain free from infection Outcome: Progressing Goal: Diagnostic test results will improve Outcome: Progressing Goal: Respiratory complications will improve Outcome: Progressing Goal: Cardiovascular complication will be avoided Outcome: Completed/Met   Problem: Activity: Goal: Risk for activity intolerance will decrease Outcome: Progressing   Problem: Nutrition: Goal: Adequate nutrition will be maintained Outcome: Progressing   Problem: Coping: Goal: Level of anxiety will decrease Outcome: Completed/Met   Problem: Elimination: Goal: Will not experience complications related to bowel motility Outcome: Progressing Goal: Will not experience complications related to urinary retention Outcome: Completed/Met   Problem: Pain Managment: Goal: General experience of comfort will improve Outcome: Completed/Met   Problem: Safety: Goal: Ability to remain free from injury will improve Outcome: Completed/Met   Problem: Skin Integrity: Goal: Risk for impaired skin integrity will decrease Outcome: Completed/Met   Problem: Education: Goal: Knowledge of risk factors and measures for prevention of condition will improve Outcome: Completed/Met   Problem: Coping: Goal: Psychosocial and spiritual needs will be supported Outcome: Completed/Met   Problem: Respiratory: Goal: Will maintain a patent airway Outcome: Completed/Met Goal: Complications related to the disease process, condition or treatment will be avoided or minimized Outcome: Progressing   Problem: Activity: Goal: Ability to tolerate increased activity will  improve Outcome: Progressing   Problem: Clinical Measurements: Goal: Ability to maintain a body temperature in the normal range will improve Outcome: Completed/Met   Problem: Respiratory: Goal: Ability to maintain adequate ventilation will improve Outcome: Progressing Goal: Ability to maintain a clear airway will improve Outcome: Completed/Met

## 2018-12-22 NOTE — Progress Notes (Signed)
[facility]  PROGRESS NOTE    Julia Porter  JSE:[id number] DOB: 10/19/1950 DOA: 12/20/2018 PCP: Patient, No Pcp Per   Brief Narrative:   Julia Porter is a 68 y.o. female with medical history significant for DM, HTN admitted with hyponatremia and suspected COVID19 infection. History is taken from medical record and discussion with RN as due to technical difficulties I was unable to get an interpreter. Apparently she has family members at home suffering from [place], and for the last 2 weeks she has had progressive dyspnea, anosmia, cough productive of yellow sputum. She has been afebrile and has no treatments at home.  12/22/2018: Patient seen.  No new changes.  No new complaints.  Follow-up pending COVID testing.     Assessment & Plan:   Active Problems:   Hyponatremia   Pneumonia due to COVID-19 virus   SIADH (syndrome of inappropriate ADH production) (HCC)   Anemia   Leukocytosis   DM type 2 (diabetes mellitus, type 2) ([place])   ?Covid 19 PNA vs CAP:     -Patient remains on room air.       - exposure to COVID at home (husband/son); with 2 weeks of progressively worsening respiratory symptoms as well at lack of smell/taste      - started on IV decadron, vitamin C, Zinc sulfate     - started on rocephin, zithro     - CXR w/ findings concernign for COVID19     -Procalcitonin is less than 0.1.     - negative OP COVID test on 12/01/2018; negative NP COVID test 12/20/2018; concern by admitting physician that this is a false negative     -Inflammatory markers remain elevated (ferritin is 637, d-dimer is 2.9, CRP is 3.9 with triglyceride of 233).       -Further management will depend on hospital course.  Hyponatremia:  -Resolving. -We will repeat urine sodium, urine osmolality and serum osmolality. -Check chemistry in the morning.  DM2      - not on any medications at home     - diabetic diet     -HbA1c is 8.7%.        -Patient is currently on sliding scale insulin coverage.         -Adjust diabetes  management on discharge.    Hypertension:  -We will discontinue HCTZ considering hyponatremia.   -Continue lisinopril -Add Norvasc 5 Mg p.o. once daily.  DVT prophylaxis: lovenox Code Status: FULL Family Communication:     Disposition Plan: TBD  Antimicrobials:  . Rocephin, zithromycin   Subjective: No new complaints today.    Objective: Vitals:   12/21/18 2200 12/22/18 0546 12/22/18 0842 12/22/18 1225  BP: 137/75 138/70 (!) 142/67 136/84  Pulse: 87 (!) 105 81 89  Resp: 18 (!) 24 20 20   Temp: 98.6 F (37 C) 98 F (36.7 C) 98.1 F (36.7 C) 98.5 F (36.9 C)  TempSrc: Oral  Oral Oral  SpO2: 98% 97% 99% 99%  Weight:      Height:        Intake/Output Summary (Last 24 hours) at 12/22/2018 1604 Last data filed at 12/22/2018 1400 Gross per 24 hour  Intake 2390.56 ml  Output 500 ml  Net 1890.56 ml   Filed Weights   12/21/18 0001  Weight: 59 kg    Examination:  General: 68 y.o. female resting in bed in NAD Eyes: PERRL, normal sclera ENMT: Nares patent w/o discharge, orophaynx clear, dentition normal, ears w/o discharge/lesions/ulcers Cardiovascular: tachy, +S1, S2, no  m/g/r, equal pulses throughout Respiratory: rhonchi at bases, normal WOB GI: BS+, NDNT, no masses noted, no organomegaly noted MSK: No e/c/c Skin: No rashes, bruises, ulcerations noted Neuro: Alert to name, follows commands, no focal deficits   Data Reviewed: I have personally reviewed following labs and imaging studies.  CBC: Recent Labs  Lab 12/20/18 2358 12/21/18 0656 12/22/18 0422  WBC 12.9* 10.1 13.2*  NEUTROABS 10.1*  --  9.9*  HGB 9.9* 9.7* 8.9*  HCT 28.9* 29.0* 27.3*  MCV 73.5* 74.6* 75.8*  PLT 434* 426* 441*   Basic Metabolic Panel: Recent Labs  Lab 12/21/18 0656 12/21/18 1322 12/21/18 1753 12/21/18 2236 12/22/18 0422  NA 130* 129* 128* 128* 133*  K 4.0 4.5 4.6 4.4 4.3  CL 106 98 99 98 105  CO2 16* 17* 19* 19* 19*  GLUCOSE 259* 307* 218* 194* 123*  BUN 11 15 16 17 17    CREATININE 0.79 0.99 1.09* 1.06* 1.06*  CALCIUM 6.8* 8.4* 8.6* 8.5* 8.5*  MG  --   --   --   --  2.2   GFR: Estimated Creatinine Clearance: 40.7 mL/min (A) (by C-G formula based on SCr of 1.06 mg/dL (H)). Liver Function Tests: Recent Labs  Lab 12/20/18 2358  AST 40  ALT 50*  ALKPHOS 85  BILITOT 0.6  PROT 7.4  ALBUMIN 3.4*   No results for input(s): LIPASE, AMYLASE in the last 168 hours. No results for input(s): AMMONIA in the last 168 hours. Coagulation Profile: Recent Labs  Lab 12/20/18 2358  INR 0.9   Cardiac Enzymes: No results for input(s): CKTOTAL, CKMB, CKMBINDEX, TROPONINI in the last 168 hours. BNP (last 3 results) No results for input(s): PROBNP in the last 8760 hours. HbA1C: Recent Labs    12/20/18 2350  HGBA1C 8.7*   CBG: Recent Labs  Lab 12/21/18 1322 12/21/18 1811 12/21/18 2156 12/22/18 0835 12/22/18 1220  GLUCAP 303* 205* 201* 168* 227*   Lipid Profile: Recent Labs    12/20/18 2359  TRIG 233*   Thyroid Function Tests: No results for input(s): TSH, T4TOTAL, FREET4, T3FREE, THYROIDAB in the last 72 hours. Anemia Panel: Recent Labs    12/21/18 1332 12/22/18 0422  FERRITIN 771* 637*   Sepsis Labs: Recent Labs  Lab 12/20/18 2358 12/20/18 2359  PROCALCITON <0.10  --   LATICACIDVEN  --  1.8    Recent Results (from the past 240 hour(s))  SARS Coronavirus 2 [facility](Hospital order, Performed in [facility] hospital lab) Nasopharyngeal Nasopharyngeal Swab     Status: None   Collection Time: 12/20/18 11:59 PM   Specimen: Nasopharyngeal Swab  Result Value Ref Range Status   SARS Coronavirus 2 NEGATIVE NEGATIVE Final    Comment: (NOTE) If result is NEGATIVE SARS-CoV-2 target nucleic acids are NOT DETECTED. The SARS-CoV-2 RNA is generally detectable in upper and lower  respiratory specimens during the acute phase of infection. The lowest  concentration of SARS-CoV-2 viral copies this assay can detect is 250  copies / mL. A negative result does  not preclude SARS-CoV-2 infection  and should not be used as the sole basis for treatment or other  patient management decisions.  A negative result may occur with  improper specimen collection / handling, submission of specimen other  than nasopharyngeal swab, presence of viral mutation(s) within the  areas targeted by this assay, and inadequate number of viral copies  (<250 copies / mL). A negative result must be combined with clinical  observations, patient history, and epidemiological information. If  result is POSITIVE SARS-CoV-2 target nucleic acids are DETECTED. The SARS-CoV-2 RNA is generally detectable in upper and lower  respiratory specimens dur ing the acute phase of infection.  Positive  results are indicative of active infection with SARS-CoV-2.  Clinical  correlation with patient history and other diagnostic information is  necessary to determine patient infection status.  Positive results do  not rule out bacterial infection or co-infection with other viruses. If result is PRESUMPTIVE POSTIVE SARS-CoV-2 nucleic acids MAY BE PRESENT.   A presumptive positive result was obtained on the submitted specimen  and confirmed on repeat testing.  While 2019 novel coronavirus  (SARS-CoV-2) nucleic acids may be present in the submitted sample  additional confirmatory testing may be necessary for epidemiological  and / or clinical management purposes  to differentiate between  SARS-CoV-2 and other Sarbecovirus currently known to infect humans.  If clinically indicated additional testing with an alternate test  methodology [phone]([id number]) is advised. The SARS-CoV-2 RNA is generally  detectable in upper and lower respiratory sp ecimens during the acute  phase of infection. The expected result is Negative. Fact Sheet for Patients:  BoilerBrush.com.cyhttps://www.fda.gov/media/136312/download Fact Sheet for Healthcare Providers: https://pope.com/https://www.fda.gov/media/136313/download This test is not yet approved or  cleared by the [place] FDA and has been authorized for detection and/or diagnosis of SARS-CoV-2 by FDA under an Emergency Use Authorization (EUA).  This EUA will remain in effect (meaning this test can be used) for the duration of the COVID-19 declaration under Section 564(b)(1) of the Act, 21 U.S.C. section 360bbb-3(b)(1), unless the authorization is terminated or revoked sooner. Performed at [facility], [address] [place], [place] [address]   Culture, blood (routine x 2)     Status: None (Preliminary result)   Collection Time: 12/20/18 11:59 PM   Specimen: BLOOD  Result Value Ref Range Status   Specimen Description   Final    BLOOD LEFT ANTECUBITAL Performed at [facility], [address] [place], [place] [address]    Special Requests   Final    BOTTLES DRAWN AEROBIC AND ANAEROBIC Blood Culture results may not be optimal due to an excessive volume of blood received in culture bottles Performed at [facility], [address] [place], [place] [address]    Culture   Final    NO GROWTH 1 DAY Performed at [facility], [address] [place], [place] [address]    Report Status PENDING  Incomplete  Culture, blood (routine x 2)     Status: None (Preliminary result)   Collection Time: 12/21/18 12:00 AM   Specimen: BLOOD LEFT HAND  Result Value Ref Range Status   Specimen Description   Final    BLOOD LEFT HAND Performed at [facility], [address] [place], [place] [address]    Special Requests   Final    BOTTLES DRAWN AEROBIC AND ANAEROBIC Blood Culture results may not be optimal due to an excessive volume of blood received in culture bottles Performed at [facility], [address] [place], [place] [address]    Culture   Final    NO GROWTH 1 DAY Performed at [facility], [address] [place], [place] [address]    Report Status PENDING  Incomplete   Novel Coronavirus, NAA (hospital order; send-out to ref lab)     Status: None   Collection Time: 12/21/18  2:27 PM   Specimen: Nasopharyngeal Swab; Respiratory  Result Value  Ref Range Status   SARS-CoV-2, NAA NOT DETECTED NOT DETECTED Final    Comment: (NOTE) This test was developed and its performance characteristics determined by [facility]. This test has not been FDA cleared or approved. This test has been authorized by FDA under an Emergency Use Authorization (EUA). This test is only authorized for the duration of time the declaration that circumstances exist justifying the authorization of the emergency use of in vitro diagnostic tests for detection of SARS-CoV-2 virus and/or diagnosis of COVID-19 infection under section 564(b)(1) of the Act, 21 U.S.C. 409WJX-9(J)(4360bbb-3(b)(1), unless the authorization is terminated or revoked sooner. When diagnostic testing is negative, the possibility of a false negative result should be considered in the context of a patient's recent exposures and the presence of clinical signs and symptoms consistent with COVID-19. An individual without symptoms of COVID-19 and who is not shedding SARS-CoV-2 virus would expect to have a negative (not detected) result in this assay. Performed  At: [facility] [place] [address] [place], [place] [id number] Jolene SchimkeNagendra Sanjai MD YQ:[phone]    Coronavirus Source NASOPHARYNGEAL  Final    Comment: Performed at [facility], [address] [place], [place] [address]      Radiology Studies: Dg Chest Port 1 View  Result Date: 12/21/2018 CLINICAL DATA:  Cough and shortness of breath. COVID-19 exposure, husband diagnosed 2 weeks ago. EXAM: PORTABLE CHEST 1 VIEW COMPARISON:  None. FINDINGS: Heterogeneous bilateral lung opacities in a peripheral and mid to lower lung zone predominant distribution. The heart is normal in size with normal mediastinal contours. No pulmonary edema, large pleural  effusion or pneumothorax. No acute osseous abnormalities. IMPRESSION: Heterogeneous bilateral lung opacities in a peripheral and mid to lower lung zone predominant distribution, highly suspicious for COVID-19 pneumonia. Radiographic involvement is moderate. Electronically Signed   By: Narda RutherfordMelanie  Sanford M.D.   On: 12/21/2018 00:01     Scheduled Meds: . azithromycin  500 mg Oral Daily  . dexamethasone (DECADRON) injection  6 mg Intravenous Daily  . docusate sodium  100 mg Oral BID  . enoxaparin (LOVENOX) injection  40 mg Subcutaneous Daily  . lisinopril  10 mg Oral Daily   And  . hydrochlorothiazide  12.5 mg Oral Daily  . insulin aspart  0-15 Units Subcutaneous TID WC  . insulin aspart  0-5 Units Subcutaneous QHS  . vitamin C  500 mg Oral Daily  . zinc sulfate  220 mg Oral Daily   Continuous Infusions: . sodium chloride 75 mL/hr at 12/22/18 0832  . cefTRIAXone (ROCEPHIN)  IV 2 g (12/22/18 0900)     LOS: 1 day    Time spent: 35 minutes spent in the coordination of care today.   Barnetta ChapelSylvester I , MD [facility] Pager [phone] if 7PM-7AM, please contact night-coverage [email or web address] Password [email or web address] 12/22/2018, 4:04 PM

## 2018-12-23 LAB — GLUCOSE, CAPILLARY
Glucose-Capillary: 261 mg/dL — ABNORMAL HIGH (ref 70–99)
Glucose-Capillary: 272 mg/dL — ABNORMAL HIGH (ref 70–99)
Glucose-Capillary: 234 mg/dL — ABNORMAL HIGH (ref 70–99)

## 2018-12-23 NOTE — Progress Notes (Signed)
[facility] PROGRESS NOTE  Julia Porter DUK:[id number] DOB: Oct 14, 1950 DOA: 12/20/2018 PCP: Julia Porter, No Pcp Per  Brief summary   68 y.o.femalewith medical history significant forDM, HTN admitted with hyponatremia and suspected COVID19 infection. History is taken from medical record and discussion with RN as due to technical difficulties I was unable to get an interpreter. Apparently she has family members at home suffering from COVID, and for the last 2 weeks she has had progressive dyspnea, anosmia, cough productive of yellow sputum. She has been afebrile and has no treatments at home.  Assessment/Plan:  Suspected Covid 19 PNA vs CAP: CXR w/ findings concernign for COVID19. Procalcitonin is less than 0.1. negative OP COVID test on 12/01/2018; negative NP COVID test 12/20/2018; concern for false negative. +exposure to COVID at home (husband/son); with 2 weeks of progressively worsening respiratory symptoms as well at lack of smell/taste . Inflammatory markers remain elevated (ferritin is 637, d-dimer is 2.9, CRP is 3.9 with triglyceride of 233).  -on  IV decadron (8/23>), vitamin C, Zinc sulfate. On empiric rocephin, azithro (8/23>). Monitor oxygenation. Inflammatory markers.   Hyponatremia: improved.repeat AM  DM2. not on any medications at home. HbA1c is 8.7%. monitor on ISS while on steroid   Hypertension: Continue lisinopril. hold hctz due to hyponatremia.Marland Kitchen added Norvasc 5 Mg p.o. once daily  Code Status: full Family Communication: d/w patient, RN (indicate person spoken with, relationship, and if by phone, the number) Disposition Plan: remains inpatient   Consultants:  none  Procedures:  none  Antibiotics: Anti-infectives (From admission, onward)   Start     Dose/Rate Route Frequency Ordered Stop   12/21/18 1000  cefTRIAXone (ROCEPHIN) 2 g in sodium chloride 0.9 % 100 mL IVPB     2 g 200 mL/hr over 30 Minutes Intravenous Every 24 hours 12/21/18 0905     12/21/18 1000   azithromycin (ZITHROMAX) tablet 500 mg     500 mg Oral Daily 12/21/18 0905          (indicate start date, and stop date if known)  HPI/Subjective: D/w interpreter service.  -Reports chills. +cough. No acute SOB. No hypoxia.   Objective: Vitals:   12/22/18 2055 12/23/18 0510  BP: (!) 157/82 (!) 154/85  Pulse: 76 75  Resp:  18  Temp: 97.9 F (36.6 C) 98.2 F (36.8 C)  SpO2: 98% 99%    Intake/Output Summary (Last 24 hours) at 12/23/2018 1059 Last data filed at 12/23/2018 7543 Gross per 24 hour  Intake 1552.92 ml  Output 800 ml  Net 752.92 ml   Filed Weights   12/21/18 0001  Weight: 59 kg    Exam:   General:  No distress   Cardiovascular: s1,s2 rrr  Respiratory: diminished at bases   Abdomen: soft, tn   Musculoskeletal: no leg edema    Data Reviewed: Basic Metabolic Panel: Recent Labs  Lab 12/21/18 0656 12/21/18 1322 12/21/18 1753 12/21/18 2236 12/22/18 0422  NA 130* 129* 128* 128* 133*  K 4.0 4.5 4.6 4.4 4.3  CL 106 98 99 98 105  CO2 16* 17* 19* 19* 19*  GLUCOSE 259* 307* 218* 194* 123*  BUN 11 15 16 17 17   CREATININE 0.79 0.99 1.09* 1.06* 1.06*  CALCIUM 6.8* 8.4* 8.6* 8.5* 8.5*  MG  --   --   --   --  2.2   Liver Function Tests: Recent Labs  Lab 12/20/18 2358  AST 40  ALT 50*  ALKPHOS 85  BILITOT 0.6  PROT 7.4  ALBUMIN  3.4*   No results for input(s): LIPASE, AMYLASE in the last 168 hours. No results for input(s): AMMONIA in the last 168 hours. CBC: Recent Labs  Lab 12/20/18 2358 12/21/18 0656 12/22/18 0422  WBC 12.9* 10.1 13.2*  NEUTROABS 10.1*  --  9.9*  HGB 9.9* 9.7* 8.9*  HCT 28.9* 29.0* 27.3*  MCV 73.5* 74.6* 75.8*  PLT 434* 426* 441*   Cardiac Enzymes: No results for input(s): CKTOTAL, CKMB, CKMBINDEX, TROPONINI in the last 168 hours. BNP (last 3 results) Recent Labs    12/20/18 2359  BNP 35.9    ProBNP (last 3 results) No results for input(s): PROBNP in the last 8760 hours.  CBG: Recent Labs  Lab  12/21/18 2156 12/22/18 0835 12/22/18 1220 12/22/18 1633 12/22/18 2037  GLUCAP 201* 168* 227* 268* 154*    Recent Results (from the past 240 hour(s))  SARS Coronavirus 2 [facility] order, Performed in [facility] hospital lab) Nasopharyngeal Nasopharyngeal Swab     Status: None   Collection Time: 12/20/18 11:59 PM   Specimen: Nasopharyngeal Swab  Result Value Ref Range Status   SARS Coronavirus 2 NEGATIVE NEGATIVE Final    Comment: (NOTE) If result is NEGATIVE SARS-CoV-2 target nucleic acids are NOT DETECTED. The SARS-CoV-2 RNA is generally detectable in upper and lower  respiratory specimens during the acute phase of infection. The lowest  concentration of SARS-CoV-2 viral copies this assay can detect is 250  copies / mL. A negative result does not preclude SARS-CoV-2 infection  and should not be used as the sole basis for treatment or other  Julia Porter management decisions.  A negative result may occur with  improper specimen collection / handling, submission of specimen other  than nasopharyngeal swab, presence of viral mutation(s) within the  areas targeted by this assay, and inadequate number of viral copies  (<250 copies / mL). A negative result must be combined with clinical  observations, Julia Porter history, and epidemiological information. If result is POSITIVE SARS-CoV-2 target nucleic acids are DETECTED. The SARS-CoV-2 RNA is generally detectable in upper and lower  respiratory specimens dur ing the acute phase of infection.  Positive  results are indicative of active infection with SARS-CoV-2.  Clinical  correlation with Julia Porter history and other diagnostic information is  necessary to determine Julia Porter infection status.  Positive results do  not rule out bacterial infection or co-infection with other viruses. If result is PRESUMPTIVE POSTIVE SARS-CoV-2 nucleic acids MAY BE PRESENT.   A presumptive positive result was obtained on the submitted specimen  and confirmed on  repeat testing.  While 2019 novel coronavirus  (SARS-CoV-2) nucleic acids may be present in the submitted sample  additional confirmatory testing may be necessary for epidemiological  and / or clinical management purposes  to differentiate between  SARS-CoV-2 and other Sarbecovirus currently known to infect humans.  If clinically indicated additional testing with an alternate test  methodology [phone]) is advised. The SARS-CoV-2 RNA is generally  detectable in upper and lower respiratory sp ecimens during the acute  phase of infection. The expected result is Negative. Fact Sheet for Patients:  StrictlyIdeas.no Fact Sheet for Healthcare Providers: BankingDealers.co.za This test is not yet approved or cleared by the [place] FDA and has been authorized for detection and/or diagnosis of SARS-CoV-2 by FDA under an Emergency Use Authorization (EUA).  This EUA will remain in effect (meaning this test can be used) for the duration of the COVID-19 declaration under Section 564(b)(1) of the Act, 21 U.S.C. section 360bbb-3(b)(1), unless  the authorization is terminated or revoked sooner. Performed at [facility], [address] [place], [place] [address]   Culture, blood (routine x 2)     Status: None (Preliminary result)   Collection Time: 12/20/18 11:59 PM   Specimen: BLOOD  Result Value Ref Range Status   Specimen Description   Final    BLOOD LEFT ANTECUBITAL Performed at [facility], [address] [place], [place] [address]    Special Requests   Final    BOTTLES DRAWN AEROBIC AND ANAEROBIC Blood Culture results may not be optimal due to an excessive volume of blood received in culture bottles Performed at [facility], [address] [place], [place] [address]    Culture   Final    NO GROWTH 2 DAYS Performed at [facility], [address] [place], [place]  [address]    Report Status PENDING  Incomplete  Culture, blood (routine x 2)     Status: None (Preliminary result)   Collection Time: 12/21/18 12:00 AM   Specimen: BLOOD LEFT HAND  Result Value Ref Range Status   Specimen Description   Final    BLOOD LEFT HAND Performed at [facility], [address] [place], [place] [address]    Special Requests   Final    BOTTLES DRAWN AEROBIC AND ANAEROBIC Blood Culture results may not be optimal due to an excessive volume of blood received in culture bottles Performed at [facility], [address] [place], [place] [address]    Culture   Final    NO GROWTH 2 DAYS Performed at [facility], [address] [place], [place] [address]    Report Status PENDING  Incomplete  Novel Coronavirus, NAA (hospital order; send-out to ref lab)     Status: None   Collection Time: 12/21/18  2:27 PM   Specimen: Nasopharyngeal Swab; Respiratory  Result Value Ref Range Status   SARS-CoV-2, NAA NOT DETECTED NOT DETECTED Final    Comment: (NOTE) This test was developed and its performance characteristics determined by [facility]. This test has not been FDA cleared or approved. This test has been authorized by FDA under an Emergency Use Authorization (EUA). This test is only authorized for the duration of time the declaration that circumstances exist justifying the authorization of the emergency use of in vitro diagnostic tests for detection of SARS-CoV-2 virus and/or diagnosis of COVID-19 infection under section 564(b)(1) of the Act, 21 U.S.C. 962XBM-8(U)(1360bbb-3(b)(1), unless the authorization is terminated or revoked sooner. When diagnostic testing is negative, the possibility of a false negative result should be considered in the context of a Julia Porter's recent exposures and the presence of clinical signs and symptoms consistent with COVID-19. An individual without symptoms of COVID-19 and who is not shedding  SARS-CoV-2 virus would expect to have a negative (not detected) result in this assay. Performed  At: [facility]  [address] [place], [place] [id number] Jolene SchimkeNagendra Sanjai MD OZ:[phone]    Coronavirus Source NASOPHARYNGEAL  Final    Comment: Performed at [facility], [address] [place], [place] [address]     Studies: No results found.  Scheduled Meds: . amLODipine  5 mg Oral Daily  . azithromycin  500 mg Oral Daily  . dexamethasone (DECADRON) injection  6 mg Intravenous Daily  . docusate sodium  100 mg Oral BID  . enoxaparin (LOVENOX) injection  40 mg Subcutaneous Daily  . insulin aspart  0-15 Units Subcutaneous TID WC  . insulin aspart  0-5 Units Subcutaneous QHS  . lisinopril  10 mg Oral Daily  . vitamin C  500 mg Oral Daily  . zinc sulfate  220 mg Oral Daily   Continuous Infusions: . sodium chloride 75 mL/hr at 12/23/18 0100  . cefTRIAXone (ROCEPHIN)  IV Stopped (12/22/18 0930)    Active Problems:   Hyponatremia   Pneumonia due to COVID-19 virus   SIADH (syndrome of inappropriate ADH production) (HCC)   Anemia   Leukocytosis   DM type 2 (diabetes mellitus, type 2) (HCC)    Time spent: >25 minutes     Esperanza SheetsBURIEV, Tarick Parenteau N  [facility] Pager [phone]. If 7PM-7AM, please contact night-coverage at [email or web address], password [email or web address] 12/23/2018, 10:59 AM  LOS: 2 days

## 2018-12-23 NOTE — Evaluation (Signed)
Physical Therapy Evaluation Patient Details Name: Julia Porter MRN: [id number] DOB: 1951-02-26 Today's Date: 12/23/2018   History of Present Illness  68 y.o. female with medical history significant for DM, HTN admitted with hyponatremia and suspected COVID19 infection. Pt lives with 2 family members who are positive for covid. Pt has tested negative here.  Clinical Impression  Pt is independent with mobility, she ambulated 4360' in her hospital room, no loss of balance, no dyspnea, SaO2 98% on room air. Via interpreter, she stated she's independent with mobility and ADLs at baseline, and has assistance available from her daughter at home. Encouraged pt to sit up and to ambulate in the room independently to minimize hospital acquired deconditioning. No further PT indicated as pt is independent with mobility, will sign off.      Follow Up Recommendations No PT follow up    Equipment Recommendations  None recommended by PT    Recommendations for Other Services       Precautions / Restrictions Precautions Precautions: None Restrictions Weight Bearing Restrictions: No      Mobility  Bed Mobility Overal bed mobility: Independent                Transfers Overall transfer level: Independent                  Ambulation/Gait Ambulation/Gait assistance: Independent Gait Distance (Feet): 60 Feet Assistive device: None Gait Pattern/deviations: WFL(Within Functional Limits) Gait velocity: WNL   General Gait Details: pt ambulated in room only 2* suspected covid, no dyspnea nor loss of balance with ambulation, SaO2 98% on room air immediately after walking  Stairs            Wheelchair Mobility    Modified Rankin (Stroke Patients Only)       Balance Overall balance assessment: No apparent balance deficits (not formally assessed)                                           Pertinent Vitals/Pain Pain Assessment: No/denies pain    Home Living  Family/patient expects to be discharged to:: Private residence Living Arrangements: Spouse/significant other;Children Available Help at Discharge: Family           Home Equipment: None      Prior Function Level of Independence: Independent         Comments: pt stated she walked without AD PTA and was independent with ADLs     Hand Dominance        Extremity/Trunk Assessment   Upper Extremity Assessment Upper Extremity Assessment: Overall WFL for tasks assessed    Lower Extremity Assessment Lower Extremity Assessment: Overall WFL for tasks assessed    Cervical / Trunk Assessment Cervical / Trunk Assessment: Normal  Communication   Communication: Prefers language other than [place];Interpreter utilized  Cognition Arousal/Alertness: Awake/alert Behavior During Therapy: WFL for tasks assessed/performed Overall Cognitive Status: Within Functional Limits for tasks assessed                                        General Comments      Exercises     Assessment/Plan    PT Assessment Patent does not need any further PT services  PT Problem List         PT Treatment Interventions  PT Goals (Current goals can be found in the Care Plan section)  Acute Rehab PT Goals PT Goal Formulation: All assessment and education complete, DC therapy    Frequency     Barriers to discharge        Co-evaluation               AM-PAC PT "6 Clicks" Mobility  Outcome Measure Help needed turning from your back to your side while in a flat bed without using bedrails?: None Help needed moving from lying on your back to sitting on the side of a flat bed without using bedrails?: None Help needed moving to and from a bed to a chair (including a wheelchair)?: None Help needed standing up from a chair using your arms (e.g., wheelchair or bedside chair)?: None Help needed to walk in hospital room?: None Help needed climbing 3-5 steps with a railing? :  None 6 Click Score: 24    End of Session   Activity Tolerance: Patient tolerated treatment well Patient left: in bed Nurse Communication: Mobility status      Time: 1610-9604 PT Time Calculation (min) (ACUTE ONLY): 31 min   Charges:   PT Evaluation $PT Eval Low Complexity: 1 Low PT Treatments $Gait Training: 8-22 mins       Blondell Reveal Kistler PT 12/23/2018  Acute Rehabilitation Services Pager [phone] Office [phone]

## 2018-12-23 NOTE — Progress Notes (Signed)
OT Cancellation Note  Patient Details Name: Julia Porter MRN: [id number] DOB: 27-Aug-1950   Cancelled Treatment:    Reason Eval/Treat Not Completed: OT screened, no needs identified, will sign off; spoke with PT who reports pt independent with mobility and ADL, currently with no acute OT needs. Will sign off at this time. Please re-consult if pt's needs change. Thank you for this referral.   Lou Cal, OT Supplemental Rehabilitation Services Pager [phone] Office [phone]   Raymondo Band 12/23/2018, 1:28 PM

## 2018-12-24 DIAGNOSIS — Z20828 Contact with and (suspected) exposure to other viral communicable diseases: Secondary | ICD-10-CM

## 2018-12-24 LAB — FERRITIN: Ferritin: 783 ng/mL — ABNORMAL HIGH (ref 11–307)

## 2018-12-24 LAB — BASIC METABOLIC PANEL
Anion gap: 7 (ref 5–15)
BUN: 26 mg/dL — ABNORMAL HIGH (ref 8–23)
CO2: 22 mmol/L (ref 22–32)
Calcium: 9.1 mg/dL (ref 8.9–10.3)
Chloride: 103 mmol/L (ref 98–111)
Creatinine, Ser: 1.05 mg/dL — ABNORMAL HIGH (ref 0.44–1.00)
GFR calc Af Amer: >60 mL/min (ref >60)
GFR calc non Af Amer: 55 mL/min — ABNORMAL LOW (ref >60)
Glucose, Bld: 123 mg/dL — ABNORMAL HIGH (ref 70–99)
Potassium: 4.3 mmol/L (ref 3.5–5.1)
Sodium: 132 mmol/L — ABNORMAL LOW (ref 135–145)

## 2018-12-24 LAB — CBC
HCT: 29.1 % — ABNORMAL LOW (ref 36.0–46.0)
Hemoglobin: 9.6 g/dL — ABNORMAL LOW (ref 12.0–15.0)
MCH: 24.9 pg — ABNORMAL LOW (ref 26.0–34.0)
MCHC: 33 g/dL (ref 30.0–36.0)
MCV: 75.6 fL — ABNORMAL LOW (ref 80.0–100.0)
Platelets: 528 10*3/uL — ABNORMAL HIGH (ref 150–400)
RBC: 3.85 MIL/uL — ABNORMAL LOW (ref 3.87–5.11)
RDW: 13.3 % (ref 11.5–15.5)
WBC: 14.8 10*3/uL — ABNORMAL HIGH (ref 4.0–10.5)
nRBC: 0.1 % (ref 0.0–0.2)

## 2018-12-24 LAB — C-REACTIVE PROTEIN: CRP: 0.9 mg/dL (ref <1.0)

## 2018-12-24 LAB — D-DIMER, QUANTITATIVE: D-Dimer, Quant: 1.75 ug/mL-FEU — ABNORMAL HIGH (ref 0.00–0.50)

## 2018-12-24 LAB — GLUCOSE, CAPILLARY: Glucose-Capillary: 135 mg/dL — ABNORMAL HIGH (ref 70–99)

## 2018-12-24 LAB — SEDIMENTATION RATE: Sed Rate: 57 mm/hr — ABNORMAL HIGH (ref 0–22)

## 2018-12-24 MED ORDER — ZINC SULFATE 220 (50 ZN) MG PO CAPS: 220.0000 mg | ORAL_CAPSULE | Freq: Every day | ORAL | 0 refills | Status: DC

## 2018-12-24 MED ORDER — PULSE OXIMETER MISC: 1.0000 | 0 refills | Status: AC | PRN

## 2018-12-24 MED ORDER — AZITHROMYCIN 500 MG PO TABS: 500.0000 mg | ORAL_TABLET | Freq: Every day | ORAL | 0 refills | Status: DC

## 2018-12-24 MED ORDER — ASCORBIC ACID 500 MG PO TABS: 500.0000 mg | ORAL_TABLET | Freq: Every day | ORAL | 0 refills | Status: DC

## 2018-12-24 MED ORDER — DEXAMETHASONE 6 MG PO TABS: 6.0000 mg | ORAL_TABLET | Freq: Every day | ORAL | 0 refills | Status: AC

## 2018-12-24 NOTE — Care Management Important Message (Signed)
Important Message  Patient Details IM Letter given to [name] to present to the Patient Name: Julia Porter MRN: [id number] Date of Birth: 15-Feb-1951   Medicare Important Message Given:  Yes     Kerin Salen 12/24/2018, 11:24 AM

## 2018-12-24 NOTE — Discharge Instructions (Signed)

## 2018-12-24 NOTE — Progress Notes (Signed)
Discharge instructions thoroughly reviewed with patient via interpreter and teach back method.  Patient's questions answered clearly and appropriately with interpreter assistance.  Instructed patient that although her COVID tests were negative, that the MDs taking care of her here at the hospital felt that through her exposure to her husband and sons (who did test positive for the disease) and her elevated inflammatory lab results it could be likely she does have COVID and therefore to quarantine upon returning home until three days after she experiences her last COVID symptoms.  Patient states she has no more questions at this time and is calling her ride for transport.

## 2018-12-24 NOTE — TOC Initial Note (Signed)
Transition of Care (TOC) - Initial/Assessment Note    Patient Details  Name: Julia Porter MRN: [id number] Date of Birth: 12-30-1950  Transition of Care [facility]) CM/SW Contact:    Purcell Mouton, RN Phone Number: 12/24/2018, 11:01 AM  Clinical Narrative:                   Expected Discharge Plan: Home/Self Care Barriers to Discharge: No Barriers Identified   Patient Goals and CMS Choice        Expected Discharge Plan and Services Expected Discharge Plan: Home/Self Care   Discharge Planning Services: CM Consult   Living arrangements for the past 2 months: Single Family Home Expected Discharge Date: 12/24/18                                    Prior Living Arrangements/Services Living arrangements for the past 2 months: Single Family Home Lives with:: Spouse, Adult Children Patient language and need for interpreter reviewed:: Yes([place]) Do you feel safe going back to the place where you live?: Yes               Activities of Daily Living Home Assistive Devices/Equipment: None ADL Screening (condition at time of admission) Patient's cognitive ability adequate to safely complete daily activities?: Yes Is the patient deaf or have difficulty hearing?: No Does the patient have difficulty seeing, even when wearing glasses/contacts?: No Does the patient have difficulty concentrating, remembering, or making decisions?: No Patient able to express need for assistance with ADLs?: Yes Does the patient have difficulty dressing or bathing?: No Independently performs ADLs?: Yes (appropriate for developmental age) Does the patient have difficulty walking or climbing stairs?: No Weakness of Legs: None Weakness of Arms/Hands: None  Permission Sought/Granted Permission sought to share information with : Case Manager                Emotional Assessment Appearance:: Appears stated age     Orientation: : Oriented to Self, Oriented to Place, Oriented to  Time,  Oriented to Situation      Admission diagnosis:  Shortness of breath [R06.02] Hyponatremia [E87.1] Non-intractable vomiting with nausea, unspecified vomiting type [R11.2] Suspected Covid-19 Virus Infection [R68.89] Patient Active Problem List   Diagnosis Date Noted  . Hyponatremia 12/21/2018  . Pneumonia due to COVID-19 virus 12/21/2018  . SIADH (syndrome of inappropriate ADH production) ([place]) 12/21/2018  . Anemia 12/21/2018  . Leukocytosis 12/21/2018  . DM type 2 (diabetes mellitus, type 2) ([place]) 12/21/2018   PCP:  Patient, No Pcp Per Pharmacy:  No Pharmacies Listed    Social Determinants of Health (SDOH) Interventions    Readmission Risk Interventions No flowsheet data found.

## 2018-12-24 NOTE — Progress Notes (Signed)
Patient's ride is here and will proceed with discharge at this time.

## 2018-12-25 LAB — GLUCOSE, CAPILLARY: Glucose-Capillary: 167 mg/dL — ABNORMAL HIGH (ref 70–99)

## 2018-12-26 ENCOUNTER — Other Ambulatory Visit: Payer: Self-pay

## 2018-12-26 LAB — CULTURE, BLOOD (ROUTINE X 2)
Culture: NO GROWTH
Culture: NO GROWTH

## 2018-12-26 NOTE — Patient Outreach (Signed)
[facility]) Care Management  12/26/2018  Julia Porter 11-19-1950 [id number]     Transition of Care Referral  Referral Date: 12/26/2018 Referral Source: [facility] Discharge Report Date of Admission: 12/20/2018  Diagnosis: "suspected COVID-19" Date of Discharge: 12/24/2018 Facility: [facility]    Outreach attempt # 1 to patient using [place] Interpreter(ID # [id number]). First number listed busy signal after multiple attempts. Attempted cell phone number listed for patient and no answer and no voicemail set up.    Plan: RN CM will make outreach attempt to patient within 3-4 business days. RN CM will send unsuccessful outreach letter to patient.   Enzo Montgomery, RN,BSN,CCM [place] Management Telephonic Care Management Coordinator Direct Phone: [phone] Toll Free: [phone] Fax: [phone]

## 2018-12-27 NOTE — Discharge Summary (Signed)
[facility] Discharge Summary   Patient: Julia Porter JGO:[id number]   PCP: Patient, No Pcp Per DOB: 1950-10-27   Date of admission: 12/20/2018   Date of discharge: 12/24/2018     Discharge Diagnoses:  Principal diagnosis Acute COVID-19 pneumonia Active Problems:   Hyponatremia   Pneumonia due to COVID-19 virus   SIADH (syndrome of inappropriate ADH production) (HCC)   Anemia   Leukocytosis   DM type 2 (diabetes mellitus, type 2) (HCC)   Admitted From: Home Disposition:  Home   Recommendations for Outpatient Follow-up:  1. Please follow-up with PCP in 1 week  Follow-up Information    PCP. Schedule an appointment as soon as possible for a visit in 1 week(s).          Diet recommendation: Regular diet  Activity: The patient is advised to gradually reintroduce usual activities,as tolerated .  Discharge Condition: good  Code Status: Full code  History of present illness: As per the H and P dictated on admission, "Julia Porter is a 68 y.o. female with medical history significant for DM, HTN admitted with hyponatremia and suspected COVID19 infection. History is taken from medical record and discussion with RN as due to technical difficulties I was unable to get an interpreter. Apparently she has family members at home suffering from COVID, and for the last 2 weeks she has had progressive dyspnea, anosmia, cough productive of yellow sputum. She has been afebrile and has no treatments at home.    ED Course: In the ER, she was given IV steroids and CXR was noted to be abnormal. She also has elevated inflammatory markers which have been associated with COVID "  Hospital Course:  Summary of her active problems in the hospital is as following. Suspected Covid 19 PNA vs CAP:  CXR w/ findings concerning for COVID19.  Procalcitonin is less than 0.1.  negative OP COVID test on 12/01/2018;  negative IP COVID test 12/20/2018;  concern for false negative.  +exposure to COVID at home  (husband/son); with 2 weeks of progressively worsening respiratory symptoms as well at lack of smell/taste .  Inflammatory markers remain elevated  Treated with IV decadron (8/23>), vitamin C, Zinc sulfate.  On empiric rocephin, azithro (8/23>).  Currently at home is better Completed treatment for pneumonia continue azithromycin Decadron for few days.  Hyponatremia: improved  DM2. not on any medications at home. HbA1c is 8.7%. Will require follow-up with PCP to address this  Hypertension:Continue home medication.   Patient was ambulatory without any assistance. On the day of the discharge the patient's vitals were stable, and no other acute medical condition were reported by patient. the patient was felt safe to be discharge at Home with no therapy needed on discharge.  Consultants: none Procedures: none  DISCHARGE MEDICATION: Allergies as of 12/24/2018   No Known Allergies     Medication List    TAKE these medications   ascorbic acid 500 MG tablet Commonly known as: VITAMIN C Take 1 tablet (500 mg total) by mouth daily. Notes to patient: Tomorrow 8/27 AM   azithromycin 500 MG tablet Commonly known as: ZITHROMAX Take 1 tablet (500 mg total) by mouth daily. Notes to patient: Tomorrow 8/27   dexamethasone 6 MG tablet Commonly known as: DECADRON Take 1 tablet (6 mg total) by mouth daily for 5 days. Notes to patient: Start tomorrow 8/27 AM   glipiZIDE 5 MG tablet Commonly known as: GLUCOTROL Take 5 mg by mouth daily. Notes to patient: Tomorrow 8/27 AM  lisinopril-hydrochlorothiazide 10-12.5 MG tablet Commonly known as: ZESTORETIC Take 1 tablet by mouth daily. For blood pressure Notes to patient: Tomorrow 8/27 AM   pravastatin 40 MG tablet Commonly known as: PRAVACHOL Take 40 mg by mouth daily. Notes to patient: Tomorrow 8/27 AM   Pulse Oximeter Misc 1 each by Does not apply route as needed. Notes to patient: Purchase at your earliest convenience to  monitor oxygen levels at home   zinc sulfate 220 (50 Zn) MG capsule Take 1 capsule (220 mg total) by mouth daily. Notes to patient: Tomorrow 8/27 AM      No Known Allergies Discharge Instructions    Diet - low sodium heart healthy   Complete by: As directed    Increase activity slowly   Complete by: As directed      Discharge Exam: Filed Weights   12/21/18 0001  Weight: 59 kg   Vitals:   12/23/18 2134 12/24/18 0539  BP: 137/69 (!) 160/87  Pulse: 78 77  Resp: 16 14  Temp: 99.1 F (37.3 C) 98.2 F (36.8 C)  SpO2: 97% 100%   General: Appear in no distress, no Rash; Oral Mucosa Clear, moist. no Abnormal Mass Or lumps Cardiovascular: S1 and S2 Present, no Murmur, Respiratory: normal respiratory effort, Bilateral Air entry present and Clear to Auscultation, no Crackles, no wheezes Abdomen: Bowel Sound present, Soft and no tenderness, no hernia Extremities: no Pedal edema, no calf tenderness Neurology: alert and oriented to time, place, and person affect appropriate. normal without focal findings, mental status, speech normal, alert and oriented x3, PERLA, Motor strength 5/5 and symmetric and sensation grossly normal to light touch   The results of significant diagnostics from this hospitalization (including imaging, microbiology, ancillary and laboratory) are listed below for reference.    Significant Diagnostic Studies: Dg Chest Port 1 View  Result Date: 12/21/2018 CLINICAL DATA:  Cough and shortness of breath. COVID-19 exposure, husband diagnosed 2 weeks ago. EXAM: PORTABLE CHEST 1 VIEW COMPARISON:  None. FINDINGS: Heterogeneous bilateral lung opacities in a peripheral and mid to lower lung zone predominant distribution. The heart is normal in size with normal mediastinal contours. No pulmonary edema, large pleural effusion or pneumothorax. No acute osseous abnormalities. IMPRESSION: Heterogeneous bilateral lung opacities in a peripheral and mid to lower lung zone predominant  distribution, highly suspicious for COVID-19 pneumonia. Radiographic involvement is moderate. Electronically Signed   By: Narda RutherfordMelanie  Sanford M.D.   On: 12/21/2018 00:01    Microbiology: Recent Results (from the past 240 hour(s))  SARS Coronavirus 2 [facility](Hospital order, Performed in [facility] hospital lab) Nasopharyngeal Nasopharyngeal Swab     Status: None   Collection Time: 12/20/18 11:59 PM   Specimen: Nasopharyngeal Swab  Result Value Ref Range Status   SARS Coronavirus 2 NEGATIVE NEGATIVE Final    Comment: (NOTE) If result is NEGATIVE SARS-CoV-2 target nucleic acids are NOT DETECTED. The SARS-CoV-2 RNA is generally detectable in upper and lower  respiratory specimens during the acute phase of infection. The lowest  concentration of SARS-CoV-2 viral copies this assay can detect is 250  copies / mL. A negative result does not preclude SARS-CoV-2 infection  and should not be used as the sole basis for treatment or other  patient management decisions.  A negative result may occur with  improper specimen collection / handling, submission of specimen other  than nasopharyngeal swab, presence of viral mutation(s) within the  areas targeted by this assay, and inadequate number of viral copies  (<250 copies / mL).  A negative result must be combined with clinical  observations, patient history, and epidemiological information. If result is POSITIVE SARS-CoV-2 target nucleic acids are DETECTED. The SARS-CoV-2 RNA is generally detectable in upper and lower  respiratory specimens dur ing the acute phase of infection.  Positive  results are indicative of active infection with SARS-CoV-2.  Clinical  correlation with patient history and other diagnostic information is  necessary to determine patient infection status.  Positive results do  not rule out bacterial infection or co-infection with other viruses. If result is PRESUMPTIVE POSTIVE SARS-CoV-2 nucleic acids MAY BE PRESENT.   A presumptive  positive result was obtained on the submitted specimen  and confirmed on repeat testing.  While 2019 novel coronavirus  (SARS-CoV-2) nucleic acids may be present in the submitted sample  additional confirmatory testing may be necessary for epidemiological  and / or clinical management purposes  to differentiate between  SARS-CoV-2 and other Sarbecovirus currently known to infect humans.  If clinically indicated additional testing with an alternate test  methodology [phone] is advised. The SARS-CoV-2 RNA is generally  detectable in upper and lower respiratory sp ecimens during the acute  phase of infection. The expected result is Negative. Fact Sheet for Patients:  BoilerBrush.com.cyhttps://www.fda.gov/media/136312/download Fact Sheet for Healthcare Providers: https://pope.com/https://www.fda.gov/media/136313/download This test is not yet approved or cleared by the [place] FDA and has been authorized for detection and/or diagnosis of SARS-CoV-2 by FDA under an Emergency Use Authorization (EUA).  This EUA will remain in effect (meaning this test can be used) for the duration of the COVID-19 declaration under Section 564(b)(1) of the Act, 21 U.S.C. section 360bbb-3(b)(1), unless the authorization is terminated or revoked sooner. Performed at [facility], [address] [place], [place] [address]   Culture, blood (routine x 2)     Status: None   Collection Time: 12/20/18 11:59 PM   Specimen: BLOOD  Result Value Ref Range Status   Specimen Description   Final    BLOOD LEFT ANTECUBITAL Performed at [facility], [address] [place], [place] [address]    Special Requests   Final    BOTTLES DRAWN AEROBIC AND ANAEROBIC Blood Culture results may not be optimal due to an excessive volume of blood received in culture bottles Performed at [facility], [address] [place], [place] [address]    Culture   Final    NO GROWTH 5 DAYS Performed at [facility], [address] [place], [place] [address]    Report Status 12/26/2018 FINAL  Final  Culture, blood (routine x 2)     Status: None   Collection Time: 12/21/18 12:00 AM   Specimen: BLOOD LEFT HAND  Result Value Ref Range Status   Specimen Description   Final    BLOOD LEFT HAND Performed at [facility], [address] [place], [place] [address]    Special Requests   Final    BOTTLES DRAWN AEROBIC AND ANAEROBIC Blood Culture results may not be optimal due to an excessive volume of blood received in culture bottles Performed at [facility], [address] [place], [place] [address]    Culture   Final    NO GROWTH 5 DAYS Performed at [facility], [address] [place], [place] [address]    Report Status 12/26/2018 FINAL  Final  Novel Coronavirus, NAA (hospital order; send-out to ref lab)     Status: None   Collection  Time: 12/21/18  2:27 PM   Specimen: Nasopharyngeal Swab; Respiratory  Result Value Ref Range Status   SARS-CoV-2, NAA NOT DETECTED NOT DETECTED Final    Comment: (NOTE) This test was developed and its performance characteristics determined by [employer]. This test has not been FDA cleared or approved. This test has been authorized by FDA under an Emergency Use Authorization (EUA). This test is only authorized for the duration of time the declaration that circumstances exist justifying the authorization of the emergency use of in vitro diagnostic tests for detection of SARS-CoV-2 virus and/or diagnosis of COVID-19 infection under section 564(b)(1) of the Act, 21 U.S.C. 732KGU-5(K)(2), unless the authorization is terminated or revoked sooner. When diagnostic testing is negative, the possibility of a false negative result should be considered in the context of a patient's recent exposures and the presence of clinical signs and symptoms consistent with COVID-19. An individual without symptoms  of COVID-19 and who is not shedding SARS-CoV-2 virus would expect to have a negative (not detected) result in this assay. Performed  At: [facility] [place], [place] [address] Rush Farmer MD BT:[id number]    [place]  Final    Comment: Performed at [facility] [address] [place], [place] [address]     Labs: CBC: Recent Labs  Lab 12/20/18 2358 12/21/18 0656 12/22/18 0422 12/24/18 0455  WBC 12.9* 10.1 13.2* 14.8*  NEUTROABS 10.1*  --  9.9*  --   HGB 9.9* 9.7* 8.9* 9.6*  HCT 28.9* 29.0* 27.3* 29.1*  MCV 73.5* 74.6* 75.8* 75.6*  PLT 434* 426* 441* 948*   Basic Metabolic Panel: Recent Labs  Lab 12/21/18 1322 12/21/18 1753 12/21/18 2236 12/22/18 0422 12/24/18 0455  NA 129* 128* 128* 133* 132*  K 4.5 4.6 4.4 4.3 4.3  CL 98 99 98 105 103  CO2 17* 19* 19* 19* 22  GLUCOSE 307* 218* 194* 123* 123*  BUN 15 16 17 17  26*  CREATININE 0.99 1.09* 1.06* 1.06* 1.05*  CALCIUM 8.4* 8.6* 8.5* 8.5* 9.1  MG  --   --   --  2.2  --    Liver Function Tests: Recent Labs  Lab 12/20/18 2358  AST 40  ALT 50*  ALKPHOS 85  BILITOT 0.6  PROT 7.4  ALBUMIN 3.4*   No results for input(s): LIPASE, AMYLASE in the last 168 hours. No results for input(s): AMMONIA in the last 168 hours. Cardiac Enzymes: No results for input(s): CKTOTAL, CKMB, CKMBINDEX, TROPONINI in the last 168 hours. BNP (last 3 results) Recent Labs    12/20/18 2359  BNP 35.9   CBG: Recent Labs  Lab 12/23/18 0905 12/23/18 1210 12/23/18 1640 12/23/18 2131 12/24/18 0811  GLUCAP 167* 261* 272* 234* 135*   Time spent: 35 minutes  Signed:  Berle Mull  [facility] 12/24/2018

## 2018-12-29 ENCOUNTER — Other Ambulatory Visit: Payer: Self-pay

## 2018-12-29 NOTE — Patient Outreach (Signed)
[facility]) Care Management  12/29/2018  Julia Porter 1950-09-15 [id number]   Transition of Care Referral  Referral Date: 12/26/2018 Referral Source: [facility] Discharge Report Date of Admission: 12/20/2018  Diagnosis: "suspected COVID-19" Date of Discharge: 12/24/2018 Facility: [facility]   Outreach attempt #2 to patient via interpreter services(ID [phone]). A female answered the phoneand identiifed himself as patient's spouse. He reports that patient is at work today and will not be home until after 5pm. Advised spouse that RN CM would try to reach patient at another day and time.       Plan: RN CM will make outreach attempt to patient within 3-4 business days.   Enzo Montgomery, RN,BSN,CCM [facility] [facility] Telephonic Care Management Coordinator Direct Phone: [phone] Toll Free: [phone] Fax: [phone]

## 2018-12-30 ENCOUNTER — Other Ambulatory Visit: Payer: Self-pay

## 2018-12-30 NOTE — Patient Outreach (Signed)
[facility]) Care Management  12/30/2018  Julia Porter 27-Oct-1950 [id number]   Transition of Care Referral  Referral Date:12/26/2018 Referral Source:[employer] Discharge Report Date of Admission:12/20/2018 Diagnosis:"suspected COVID-19" Date of Discharge:12/24/2018 [place] [facility]     Outreach attempt #3 to patient. No answer at present.    Plan: RN CM will close case if no response from letter sent to patient.   Enzo Montgomery, RN,BSN,CCM [place] [facility] Telephonic Care Management Coordinator Direct Phone: [phone] Toll Free: [phone] Fax: [phone]

## 2019-01-09 ENCOUNTER — Other Ambulatory Visit: Payer: Self-pay

## 2019-01-09 NOTE — Patient Outreach (Signed)
[facility]) Care Management  01/09/2019  [name] [birth year] [id number]   Transition of Care Referral  Referral Date:12/26/2018 Referral Source:[employer] Discharge Report Date of Admission:12/20/2018 Diagnosis:"suspected COVID-19" Date of Discharge:12/24/2018 [place] [facility]    Multiple attempts to establish contact with patient without success. No response from letter mailed to patient. Case is being closed at this time.     Plan: RN CM will close case at this time.   Julia Montgomery, RN,BSN,CCM [place] [facility] Telephonic Care Management Coordinator Direct Phone: [phone] Toll Free: [phone] Fax: [phone]

## 2019-01-16 DIAGNOSIS — I10 Essential (primary) hypertension: Secondary | ICD-10-CM | POA: Diagnosis not present

## 2019-01-16 DIAGNOSIS — Z23 Encounter for immunization: Secondary | ICD-10-CM | POA: Diagnosis not present

## 2019-01-16 DIAGNOSIS — E119 Type 2 diabetes mellitus without complications: Secondary | ICD-10-CM | POA: Diagnosis not present

## 2019-01-16 DIAGNOSIS — E785 Hyperlipidemia, unspecified: Secondary | ICD-10-CM | POA: Diagnosis not present

## 2019-04-17 DIAGNOSIS — I10 Essential (primary) hypertension: Secondary | ICD-10-CM | POA: Diagnosis not present

## 2019-04-17 DIAGNOSIS — E119 Type 2 diabetes mellitus without complications: Secondary | ICD-10-CM | POA: Diagnosis not present

## 2019-04-17 DIAGNOSIS — J4520 Mild intermittent asthma, uncomplicated: Secondary | ICD-10-CM | POA: Diagnosis not present

## 2019-04-17 DIAGNOSIS — E785 Hyperlipidemia, unspecified: Secondary | ICD-10-CM | POA: Diagnosis not present

## 2019-07-17 DIAGNOSIS — E785 Hyperlipidemia, unspecified: Secondary | ICD-10-CM | POA: Diagnosis not present

## 2019-07-17 DIAGNOSIS — Z0001 Encounter for general adult medical examination with abnormal findings: Secondary | ICD-10-CM | POA: Diagnosis not present

## 2019-07-17 DIAGNOSIS — E119 Type 2 diabetes mellitus without complications: Secondary | ICD-10-CM | POA: Diagnosis not present

## 2019-07-17 DIAGNOSIS — J4520 Mild intermittent asthma, uncomplicated: Secondary | ICD-10-CM | POA: Diagnosis not present

## 2019-07-17 DIAGNOSIS — I10 Essential (primary) hypertension: Secondary | ICD-10-CM | POA: Diagnosis not present

## 2019-07-27 ENCOUNTER — Ambulatory Visit: Payer: Medicare HMO | Attending: Internal Medicine

## 2019-11-06 DIAGNOSIS — E119 Type 2 diabetes mellitus without complications: Secondary | ICD-10-CM | POA: Diagnosis not present

## 2019-11-06 DIAGNOSIS — I10 Essential (primary) hypertension: Secondary | ICD-10-CM | POA: Diagnosis not present

## 2019-11-06 DIAGNOSIS — M25511 Pain in right shoulder: Secondary | ICD-10-CM | POA: Diagnosis not present

## 2019-11-06 DIAGNOSIS — J4520 Mild intermittent asthma, uncomplicated: Secondary | ICD-10-CM | POA: Diagnosis not present

## 2019-11-06 DIAGNOSIS — E785 Hyperlipidemia, unspecified: Secondary | ICD-10-CM | POA: Diagnosis not present

## 2019-12-04 DIAGNOSIS — E119 Type 2 diabetes mellitus without complications: Secondary | ICD-10-CM | POA: Diagnosis not present

## 2019-12-04 DIAGNOSIS — E785 Hyperlipidemia, unspecified: Secondary | ICD-10-CM | POA: Diagnosis not present

## 2019-12-04 DIAGNOSIS — M25511 Pain in right shoulder: Secondary | ICD-10-CM | POA: Diagnosis not present

## 2019-12-04 DIAGNOSIS — J4520 Mild intermittent asthma, uncomplicated: Secondary | ICD-10-CM | POA: Diagnosis not present

## 2019-12-04 DIAGNOSIS — I10 Essential (primary) hypertension: Secondary | ICD-10-CM | POA: Diagnosis not present

## 2020-02-12 DIAGNOSIS — E785 Hyperlipidemia, unspecified: Secondary | ICD-10-CM | POA: Diagnosis not present

## 2020-02-12 DIAGNOSIS — M25511 Pain in right shoulder: Secondary | ICD-10-CM | POA: Diagnosis not present

## 2020-02-12 DIAGNOSIS — Z23 Encounter for immunization: Secondary | ICD-10-CM | POA: Diagnosis not present

## 2020-02-12 DIAGNOSIS — E119 Type 2 diabetes mellitus without complications: Secondary | ICD-10-CM | POA: Diagnosis not present

## 2020-02-12 DIAGNOSIS — I10 Essential (primary) hypertension: Secondary | ICD-10-CM | POA: Diagnosis not present

## 2020-02-12 DIAGNOSIS — J4520 Mild intermittent asthma, uncomplicated: Secondary | ICD-10-CM | POA: Diagnosis not present

## 2020-06-17 DIAGNOSIS — J4520 Mild intermittent asthma, uncomplicated: Secondary | ICD-10-CM | POA: Diagnosis not present

## 2020-06-17 DIAGNOSIS — I10 Essential (primary) hypertension: Secondary | ICD-10-CM | POA: Diagnosis not present

## 2020-06-17 DIAGNOSIS — E785 Hyperlipidemia, unspecified: Secondary | ICD-10-CM | POA: Diagnosis not present

## 2020-06-17 DIAGNOSIS — E119 Type 2 diabetes mellitus without complications: Secondary | ICD-10-CM | POA: Diagnosis not present

## 2020-07-15 DIAGNOSIS — E785 Hyperlipidemia, unspecified: Secondary | ICD-10-CM | POA: Diagnosis not present

## 2020-07-15 DIAGNOSIS — J4520 Mild intermittent asthma, uncomplicated: Secondary | ICD-10-CM | POA: Diagnosis not present

## 2020-07-15 DIAGNOSIS — E119 Type 2 diabetes mellitus without complications: Secondary | ICD-10-CM | POA: Diagnosis not present

## 2020-07-15 DIAGNOSIS — N289 Disorder of kidney and ureter, unspecified: Secondary | ICD-10-CM | POA: Diagnosis not present

## 2020-07-15 DIAGNOSIS — E878 Other disorders of electrolyte and fluid balance, not elsewhere classified: Secondary | ICD-10-CM | POA: Diagnosis not present

## 2020-07-15 DIAGNOSIS — I10 Essential (primary) hypertension: Secondary | ICD-10-CM | POA: Diagnosis not present

## 2020-10-14 DIAGNOSIS — Z0001 Encounter for general adult medical examination with abnormal findings: Secondary | ICD-10-CM | POA: Diagnosis not present

## 2020-10-14 DIAGNOSIS — J4520 Mild intermittent asthma, uncomplicated: Secondary | ICD-10-CM | POA: Diagnosis not present

## 2020-10-14 DIAGNOSIS — E119 Type 2 diabetes mellitus without complications: Secondary | ICD-10-CM | POA: Diagnosis not present

## 2020-10-14 DIAGNOSIS — N1832 Chronic kidney disease, stage 3b: Secondary | ICD-10-CM | POA: Diagnosis not present

## 2020-10-14 DIAGNOSIS — E782 Mixed hyperlipidemia: Secondary | ICD-10-CM | POA: Diagnosis not present

## 2020-10-14 DIAGNOSIS — I10 Essential (primary) hypertension: Secondary | ICD-10-CM | POA: Diagnosis not present

## 2020-10-23 ENCOUNTER — Other Ambulatory Visit: Payer: Self-pay

## 2020-10-23 ENCOUNTER — Encounter (HOSPITAL_COMMUNITY): Payer: Self-pay

## 2020-10-23 ENCOUNTER — Ambulatory Visit (HOSPITAL_COMMUNITY)
Admission: EM | Admit: 2020-10-23 | Discharge: 2020-10-23 | Disposition: A | Discharge status: Home / Self Care | Payer: Medicare HMO

## 2020-10-23 DIAGNOSIS — K5900 Constipation, unspecified: Secondary | ICD-10-CM

## 2020-10-23 NOTE — ED Provider Notes (Addendum)
[facility]    CSN: [id number] Arrival date & time: 10/23/20  1311      History   Chief Complaint Chief Complaint  Patient presents with   Constipation    HPI Julia Porter is a 70 y.o. female.  Patient is accompanied by her husband and her son-in-law.  Her son-in-law speaks [place]  Her husband speaks [place] and her specific dialect.  The son in law is the translator in between.  HPI  Constipation: They state that patient has had constipation for 3 weeks.  They have been using MiraLAX for her constipation.  They state that she had 1 dose of MiraLAX and she had loose stool for a day.  Loose stool is brown without blood or black coloration. They state that they then gave her Imodium AD and she has not had a bowel movement in 4 days.  She is starting to have some abdominal discomfort from this which is why they brought her in today.  They deny any fever, vomiting, dysuria.  She is not up-to-date on her colonoscopies.   Past Medical History:  Diagnosis Date   Diabetes mellitus without complication (HCC)    Hypertension     Patient Active Problem List   Diagnosis Date Noted   Hyponatremia 12/21/2018   Pneumonia due to COVID-19 virus 12/21/2018   SIADH (syndrome of inappropriate ADH production) (HCC) 12/21/2018   Anemia 12/21/2018   Leukocytosis 12/21/2018   DM type 2 (diabetes mellitus, type 2) (HCC) 12/21/2018    History reviewed. No pertinent surgical history.  OB History   No obstetric history on file.      Home Medications    Prior to Admission medications   Medication Sig Start Date End Date Taking? Authorizing Provider  azithromycin (ZITHROMAX) 500 MG tablet Take 1 tablet (500 mg total) by mouth daily. 12/24/18   Rolly Salter, MD  glipiZIDE (GLUCOTROL) 5 MG tablet Take 5 mg by mouth daily. 09/27/18   [provider]  lisinopril-hydrochlorothiazide (PRINZIDE,ZESTORETIC) 10-12.5 MG tablet Take 1 tablet by mouth daily. For blood pressure  02/17/16   Linna Hoff, MD  Misc. Devices (PULSE OXIMETER) MISC 1 each by Does not apply route as needed. 12/24/18   Rolly Salter, MD  pravastatin (PRAVACHOL) 40 MG tablet Take 40 mg by mouth daily. 09/27/18   [provider]  vitamin C (VITAMIN C) 500 MG tablet Take 1 tablet (500 mg total) by mouth daily. 12/25/18   Rolly Salter, MD  zinc sulfate 220 (50 Zn) MG capsule Take 1 capsule (220 mg total) by mouth daily. 12/24/18   Rolly Salter, MD    Family History History reviewed. No pertinent family history.  Social History Social History   Tobacco Use   Smoking status: Never   Smokeless tobacco: Never     Allergies   Patient has no known allergies.   Review of Systems Review of Systems  As stated above in HPI Physical Exam Triage Vital Signs ED Triage Vitals  Enc Vitals Group     BP 10/23/20 1349 (!) 151/63     Pulse Rate 10/23/20 1349 100     Resp 10/23/20 1349 16     Temp 10/23/20 1349 98.6 F (37 C)     Temp Source 10/23/20 1349 Oral     SpO2 10/23/20 1349 100 %     Weight --      Height --      Head Circumference --  Peak Flow --      Pain Score 10/23/20 1357 7     Pain Loc --      Pain Edu? --      Excl. in GC? --    No data found.  Updated Vital Signs BP (!) 151/63 (BP Location: Left Arm)   Pulse 100   Temp 98.6 F (37 C) (Oral)   Resp 16   SpO2 100%   Physical Exam Vitals and nursing note reviewed.  Constitutional:      General: She is not in acute distress.    Appearance: Normal appearance. She is not ill-appearing, toxic-appearing or diaphoretic.  Cardiovascular:     Rate and Rhythm: Normal rate and regular rhythm.     Heart sounds: Normal heart sounds.  Pulmonary:     Effort: Pulmonary effort is normal.     Breath sounds: Normal breath sounds.  Abdominal:     General: Bowel sounds are normal. There is no distension.     Palpations: Abdomen is soft. There is no mass.     Tenderness: There is no abdominal tenderness.  There is no right CVA tenderness, left CVA tenderness, guarding or rebound.     Hernia: No hernia is present.  Skin:    General: Skin is warm.     Coloration: Skin is not jaundiced or pale.  Neurological:     Mental Status: She is alert.     UC Treatments / Results  Labs (all labs ordered are listed, but only abnormal results are displayed) Labs Reviewed - No data to display  EKG   Radiology No results found.  Procedures Procedures (including critical care time)  Medications Ordered in UC Medications - No data to display  Initial Impression / Assessment and Plan / UC Course  I have reviewed the triage vital signs and the nursing notes.  Pertinent labs & imaging results that were available during my care of the patient were reviewed by me and considered in my medical decision making (see chart for details).     New.  We discussed what is normal after taking MiraLAX.  We discussed that Imodium A-D can cause the lack of stool for 4 days.  I want her to take the MiraLAX every other day, just 1 dose per day.  I want them to follow-up with GI given her troubles with constipation over the past 3 weeks as we discussed that we want to ensure that there is nothing that is causing the constipation internally.  We discussed red flag signs and symptoms. Final Clinical Impressions(s) / UC Diagnoses   Final diagnoses:  None   Discharge Instructions   None    ED Prescriptions   None    PDMP not reviewed this encounter.   Rushie Chestnut, PA-C 10/23/20 1452    Rushie Chestnut, PA-C 10/23/20 1453

## 2020-10-23 NOTE — ED Triage Notes (Addendum)
Per Interpreter, pt has been having diarrhea for three weeks. Pt was giving a laxative to help go to the bathroom but she cannot have a bowel movement on her own, she has to be giving a laxative. Pt now is complaining of abdominal and back pain.

## 2020-11-03 DIAGNOSIS — J4520 Mild intermittent asthma, uncomplicated: Secondary | ICD-10-CM | POA: Diagnosis not present

## 2020-11-03 DIAGNOSIS — Z0001 Encounter for general adult medical examination with abnormal findings: Secondary | ICD-10-CM | POA: Diagnosis not present

## 2020-11-03 DIAGNOSIS — E119 Type 2 diabetes mellitus without complications: Secondary | ICD-10-CM | POA: Diagnosis not present

## 2020-11-03 DIAGNOSIS — E559 Vitamin D deficiency, unspecified: Secondary | ICD-10-CM | POA: Diagnosis not present

## 2020-11-03 DIAGNOSIS — T50905A Adverse effect of unspecified drugs, medicaments and biological substances, initial encounter: Secondary | ICD-10-CM | POA: Diagnosis not present

## 2020-11-03 DIAGNOSIS — E782 Mixed hyperlipidemia: Secondary | ICD-10-CM | POA: Diagnosis not present

## 2020-11-03 DIAGNOSIS — R052 Subacute cough: Secondary | ICD-10-CM | POA: Diagnosis not present

## 2020-11-03 DIAGNOSIS — Z1329 Encounter for screening for other suspected endocrine disorder: Secondary | ICD-10-CM | POA: Diagnosis not present

## 2020-11-03 DIAGNOSIS — I10 Essential (primary) hypertension: Secondary | ICD-10-CM | POA: Diagnosis not present

## 2020-11-10 ENCOUNTER — Other Ambulatory Visit: Payer: Self-pay | Admitting: Internal Medicine

## 2020-11-10 DIAGNOSIS — Z1231 Encounter for screening mammogram for malignant neoplasm of breast: Secondary | ICD-10-CM

## 2020-12-12 IMAGING — DX PORTABLE CHEST - 1 VIEW
1 series · 1 of 1 positions shown · non-contrast
Comparison: None.

CLINICAL DATA: Cough and shortness of breath. 7B14T-23 exposure,
husband diagnosed 2 weeks ago.

EXAM:
PORTABLE CHEST 1 VIEW

[chest ap]
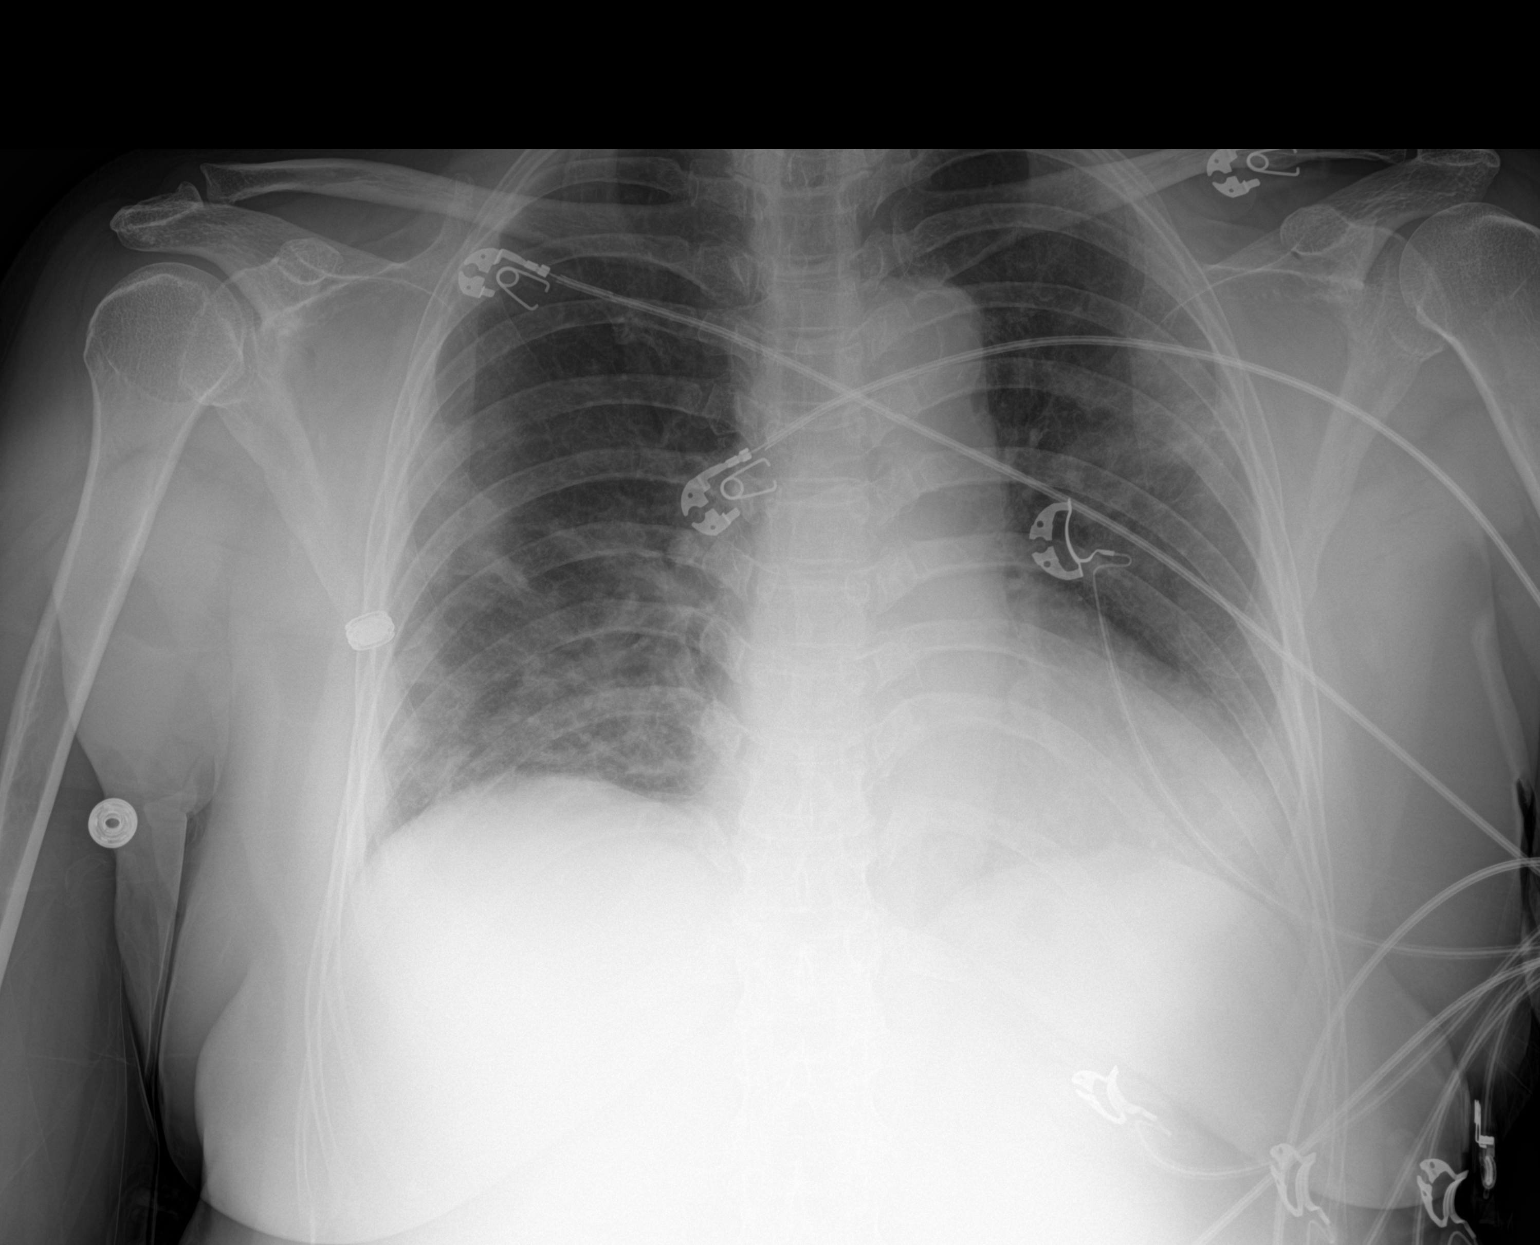

[1 of 1 positions shown; findings below may reference images not displayed]

FINDINGS: Heterogeneous bilateral lung opacities in a peripheral and mid to
lower lung zone predominant distribution. The heart is normal in
size with normal mediastinal contours. No pulmonary edema, large
pleural effusion or pneumothorax. No acute osseous abnormalities.
IMPRESSION: Heterogeneous bilateral lung opacities in a peripheral and mid to
lower lung zone predominant distribution, highly suspicious for
7B14T-23 pneumonia. Radiographic involvement is moderate.

## 2020-12-15 ENCOUNTER — Other Ambulatory Visit: Payer: Self-pay | Admitting: Internal Medicine

## 2020-12-15 DIAGNOSIS — E2839 Other primary ovarian failure: Secondary | ICD-10-CM

## 2021-01-09 DIAGNOSIS — I10 Essential (primary) hypertension: Secondary | ICD-10-CM | POA: Diagnosis not present

## 2021-01-09 DIAGNOSIS — E782 Mixed hyperlipidemia: Secondary | ICD-10-CM | POA: Diagnosis not present

## 2021-01-09 DIAGNOSIS — N1832 Chronic kidney disease, stage 3b: Secondary | ICD-10-CM | POA: Diagnosis not present

## 2021-01-09 DIAGNOSIS — E559 Vitamin D deficiency, unspecified: Secondary | ICD-10-CM | POA: Diagnosis not present

## 2021-01-09 DIAGNOSIS — E119 Type 2 diabetes mellitus without complications: Secondary | ICD-10-CM | POA: Diagnosis not present

## 2021-01-09 DIAGNOSIS — Z0001 Encounter for general adult medical examination with abnormal findings: Secondary | ICD-10-CM | POA: Diagnosis not present

## 2021-01-09 DIAGNOSIS — Z1329 Encounter for screening for other suspected endocrine disorder: Secondary | ICD-10-CM | POA: Diagnosis not present

## 2021-01-09 DIAGNOSIS — J4520 Mild intermittent asthma, uncomplicated: Secondary | ICD-10-CM | POA: Diagnosis not present

## 2021-01-10 DIAGNOSIS — E559 Vitamin D deficiency, unspecified: Secondary | ICD-10-CM | POA: Diagnosis not present

## 2021-01-10 DIAGNOSIS — N1832 Chronic kidney disease, stage 3b: Secondary | ICD-10-CM | POA: Diagnosis not present

## 2021-01-10 DIAGNOSIS — E782 Mixed hyperlipidemia: Secondary | ICD-10-CM | POA: Diagnosis not present

## 2021-01-10 DIAGNOSIS — I10 Essential (primary) hypertension: Secondary | ICD-10-CM | POA: Diagnosis not present

## 2021-01-10 DIAGNOSIS — J4520 Mild intermittent asthma, uncomplicated: Secondary | ICD-10-CM | POA: Diagnosis not present

## 2021-01-10 DIAGNOSIS — E119 Type 2 diabetes mellitus without complications: Secondary | ICD-10-CM | POA: Diagnosis not present

## 2021-03-10 DIAGNOSIS — J4520 Mild intermittent asthma, uncomplicated: Secondary | ICD-10-CM | POA: Diagnosis not present

## 2021-03-10 DIAGNOSIS — E119 Type 2 diabetes mellitus without complications: Secondary | ICD-10-CM | POA: Diagnosis not present

## 2021-03-10 DIAGNOSIS — I10 Essential (primary) hypertension: Secondary | ICD-10-CM | POA: Diagnosis not present

## 2021-03-10 DIAGNOSIS — N1832 Chronic kidney disease, stage 3b: Secondary | ICD-10-CM | POA: Diagnosis not present

## 2021-03-10 DIAGNOSIS — E559 Vitamin D deficiency, unspecified: Secondary | ICD-10-CM | POA: Diagnosis not present

## 2021-03-10 DIAGNOSIS — E782 Mixed hyperlipidemia: Secondary | ICD-10-CM | POA: Diagnosis not present

## 2021-03-15 ENCOUNTER — Other Ambulatory Visit: Payer: Self-pay | Admitting: Physician Assistant

## 2021-03-15 ENCOUNTER — Other Ambulatory Visit: Payer: Self-pay | Admitting: Internal Medicine

## 2021-03-15 DIAGNOSIS — Z1231 Encounter for screening mammogram for malignant neoplasm of breast: Secondary | ICD-10-CM

## 2021-04-05 ENCOUNTER — Encounter: Payer: Self-pay | Admitting: Physician Assistant

## 2021-11-13 ENCOUNTER — Ambulatory Visit (INDEPENDENT_AMBULATORY_CARE_PROVIDER_SITE_OTHER): Payer: Medicare HMO | Admitting: Nurse Practitioner

## 2021-11-13 ENCOUNTER — Encounter: Payer: Self-pay | Admitting: Nurse Practitioner

## 2021-11-13 VITALS — BP 136/70 | HR 78 | Temp 97.4°F | Ht 61.0 in | Wt 140.0 lb

## 2021-11-13 DIAGNOSIS — Z1159 Encounter for screening for other viral diseases: Secondary | ICD-10-CM | POA: Diagnosis not present

## 2021-11-13 DIAGNOSIS — Z1231 Encounter for screening mammogram for malignant neoplasm of breast: Secondary | ICD-10-CM | POA: Diagnosis not present

## 2021-11-13 DIAGNOSIS — E1159 Type 2 diabetes mellitus with other circulatory complications: Secondary | ICD-10-CM

## 2021-11-13 DIAGNOSIS — E119 Type 2 diabetes mellitus without complications: Secondary | ICD-10-CM | POA: Diagnosis not present

## 2021-11-13 DIAGNOSIS — I10 Essential (primary) hypertension: Secondary | ICD-10-CM

## 2021-11-13 DIAGNOSIS — I152 Hypertension secondary to endocrine disorders: Secondary | ICD-10-CM

## 2021-11-13 DIAGNOSIS — Z780 Asymptomatic menopausal state: Secondary | ICD-10-CM | POA: Diagnosis not present

## 2021-11-13 DIAGNOSIS — Z1211 Encounter for screening for malignant neoplasm of colon: Secondary | ICD-10-CM

## 2021-11-13 LAB — CBC WITH DIFFERENTIAL/PLATELET
Basophils Absolute: 0 10*3/uL (ref 0.0–0.1)
Basophils Relative: 0.3 % (ref 0.0–3.0)
Eosinophils Absolute: 0.2 10*3/uL (ref 0.0–0.7)
Eosinophils Relative: 1.6 % (ref 0.0–5.0)
HCT: 32.6 % — ABNORMAL LOW (ref 36.0–46.0)
Hemoglobin: 10.6 g/dL — ABNORMAL LOW (ref 12.0–15.0)
Lymphocytes Relative: 27.7 % (ref 12.0–46.0)
Lymphs Abs: 3.1 10*3/uL (ref 0.7–4.0)
MCHC: 32.4 g/dL (ref 30.0–36.0)
MCV: 77.5 fl — ABNORMAL LOW (ref 78.0–100.0)
Monocytes Absolute: 0.7 10*3/uL (ref 0.1–1.0)
Monocytes Relative: 6.3 % (ref 3.0–12.0)
Neutro Abs: 7.2 10*3/uL (ref 1.4–7.7)
Neutrophils Relative %: 64.1 % (ref 43.0–77.0)
Platelets: 275 10*3/uL (ref 150.0–400.0)
RBC: 4.21 Mil/uL (ref 3.87–5.11)
RDW: 14.1 % (ref 11.5–15.5)
WBC: 11.2 10*3/uL — ABNORMAL HIGH (ref 4.0–10.5)

## 2021-11-13 LAB — COMPREHENSIVE METABOLIC PANEL
ALT: 23 U/L (ref 0–35)
AST: 20 U/L (ref 0–37)
Albumin: 4.4 g/dL (ref 3.5–5.2)
Alkaline Phosphatase: 93 U/L (ref 39–117)
BUN: 27 mg/dL — ABNORMAL HIGH (ref 6–23)
CO2: 24 mEq/L (ref 19–32)
Calcium: 10.4 mg/dL (ref 8.4–10.5)
Chloride: 101 mEq/L (ref 96–112)
Creatinine, Ser: 1.15 mg/dL (ref 0.40–1.20)
GFR: 48.18 mL/min — ABNORMAL LOW (ref >60.00)
Glucose, Bld: 131 mg/dL — ABNORMAL HIGH (ref 70–99)
Potassium: 4.3 mEq/L (ref 3.5–5.1)
Sodium: 136 mEq/L (ref 135–145)
Total Bilirubin: 0.4 mg/dL (ref 0.2–1.2)
Total Protein: 8.3 g/dL (ref 6.0–8.3)

## 2021-11-13 LAB — MICROALBUMIN / CREATININE URINE RATIO
Creatinine,U: 40.5 mg/dL
Microalb Creat Ratio: 1.7 mg/g (ref 0.0–30.0)
Microalb, Ur: <0.7 mg/dL (ref 0.0–1.9)

## 2021-11-13 LAB — LIPID PANEL
Cholesterol: 157 mg/dL (ref 0–200)
HDL: 37.7 mg/dL — ABNORMAL LOW (ref >39.00)
LDL Cholesterol: 83 mg/dL (ref 0–99)
NonHDL: 119.28
Total CHOL/HDL Ratio: 4
Triglycerides: 182 mg/dL — ABNORMAL HIGH (ref 0.0–149.0)
VLDL: 36.4 mg/dL (ref 0.0–40.0)

## 2021-11-13 LAB — HEMOGLOBIN A1C: Hgb A1c MFr Bld: 7.3 % — ABNORMAL HIGH (ref 4.6–6.5)

## 2021-11-13 MED ORDER — PRAVASTATIN SODIUM 40 MG PO TABS: 40.0000 mg | ORAL_TABLET | Freq: Every day | ORAL | 1 refills | Status: DC

## 2021-11-13 MED ORDER — DICLOFENAC SODIUM 1 % EX GEL: 2.0000 g | Freq: Four times a day (QID) | CUTANEOUS | 0 refills | Status: DC

## 2021-11-13 MED ORDER — LISINOPRIL-HYDROCHLOROTHIAZIDE 10-12.5 MG PO TABS: 1.0000 | ORAL_TABLET | Freq: Every day | ORAL | 1 refills | Status: DC

## 2021-11-13 MED ORDER — GLIPIZIDE ER 10 MG PO TB24: 10.0000 mg | ORAL_TABLET | Freq: Every day | ORAL | 1 refills | Status: DC

## 2021-11-13 NOTE — Assessment & Plan Note (Signed)
Chronic, stable.  We will refill her lisinopril-hydrochlorothiazide 10-12.5 mg daily.  Check CMP, CBC.  Follow-up in 6 months.

## 2021-11-13 NOTE — Patient Instructions (Addendum)
It was great to see you!  I have refilled your medications.  Start voltaren gel 4 times a day as needed for your shoulder and neck pain. You can also use heat for 20 minutes at a time.   We are checking your labs today and will let you know the results via mychart/phone.   I have placed an order for screening mammogram and bone density screening, they should call you to schedule. If you do not hear from them in the next week, please call:  [place], [place], [place]  I have ordered the cologuard which is a kit for colon cancer screening. This will be mailed to your house.   Let's follow-up in 3 months, sooner if you have concerns.  If a referral was placed today, you will be contacted for an appointment. Please note that routine referrals can sometimes take up to 3-4 weeks to process. Please call our office if you haven't heard anything after this time frame.  Take care,  Vance Peper, NP

## 2021-11-13 NOTE — Assessment & Plan Note (Signed)
She has been out of her medications for the past week.  We will check A1c today.  She has been checking her blood sugars at home and they have ranged from 100-1 20.  She denies any low blood sugars.  We will continue her glipizide 10 mg daily.  We will also restart her on pravastatin 40 mg daily.  We will check A1c, CMP, CBC, urine microalbumin today.  Follow-up in 3 months.

## 2021-11-13 NOTE — Progress Notes (Signed)
New Patient Office Visit  Subjective    Patient ID: Julia Porter, female    DOB: 16-Jul-1950  Age: 71 y.o. MRN: [id number]  CC:  Chief Complaint  Patient presents with   Establish Care    Np. Est care. Pt c/o pain in left shoulder for x1 wk. Requesting refills for BP and DM medications. Pt is fasting.     HPI Julia Porter presents for new patient visit to establish care.  Introduced to Publishing rights manager role and practice setting.  All questions answered.  Discussed provider/patient relationship and expectations.  She has been having posterior shoulder pain that radiates up her neck for the last week. She denies any injury. She describes the pain as moderate. It has slightly gotten better. She has not tried any medications at home. She denies weakness.   She has a history of diabetes. She is checking her blood sugar at home and it ranges 100-120. She denies chest pain, shortness of breath, and numbness or tingling in her feet. She has been out of her medication for 1 week.   Depression Screen done:     11/13/2021   10:16 AM  Depression screen PHQ 2/9  Decreased Interest 0  Down, Depressed, Hopeless 0  PHQ - 2 Score 0    Outpatient Encounter Medications as of 11/13/2021  Medication Sig   diclofenac Sodium (VOLTAREN ARTHRITIS PAIN) 1 % GEL Apply 2 g topically 4 (four) times daily.   pravastatin (PRAVACHOL) 40 MG tablet Take 1 tablet (40 mg total) by mouth daily.   ACCU-CHEK GUIDE test strip 4 (four) times daily. for testing   glipiZIDE (GLUCOTROL XL) 10 MG 24 hr tablet Take 1 tablet (10 mg total) by mouth daily.   lisinopril-hydrochlorothiazide (ZESTORETIC) 10-12.5 MG tablet Take 1 tablet by mouth daily. For blood pressure   Misc. Devices (PULSE OXIMETER) MISC 1 each by Does not apply route as needed.   [DISCONTINUED] azithromycin (ZITHROMAX) 500 MG tablet Take 1 tablet (500 mg total) by mouth daily.   [DISCONTINUED] glipiZIDE (GLUCOTROL XL) 10 MG 24 hr tablet Take 10 mg by mouth daily.    [DISCONTINUED] glipiZIDE (GLUCOTROL) 5 MG tablet Take 5 mg by mouth daily.   [DISCONTINUED] lisinopril-hydrochlorothiazide (PRINZIDE,ZESTORETIC) 10-12.5 MG tablet Take 1 tablet by mouth daily. For blood pressure   [DISCONTINUED] pravastatin (PRAVACHOL) 40 MG tablet Take 40 mg by mouth daily.   [DISCONTINUED] vitamin C (VITAMIN C) 500 MG tablet Take 1 tablet (500 mg total) by mouth daily.   [DISCONTINUED] zinc sulfate 220 (50 Zn) MG capsule Take 1 capsule (220 mg total) by mouth daily.   No facility-administered encounter medications on file as of 11/13/2021.    Past Medical History:  Diagnosis Date   Diabetes mellitus without complication (HCC)    Hypertension     History reviewed. No pertinent surgical history.  History reviewed. No pertinent family history.  Social History   Socioeconomic History   Marital status: Married    Spouse name: Not on file   Number of children: Not on file   Years of education: Not on file   Highest education level: Not on file  Occupational History   Not on file  Tobacco Use   Smoking status: Never   Smokeless tobacco: Never  Vaping Use   Vaping Use: Never used  Substance and Sexual Activity   Alcohol use: Never   Drug use: Never   Sexual activity: Not on file  Other Topics Concern   Not on file  Social History Narrative   Not on file   Social Determinants of Health   Financial Resource Strain: Not on file  Food Insecurity: Not on file  Transportation Needs: Not on file  Physical Activity: Not on file  Stress: Not on file  Social Connections: Not on file  Intimate Partner Violence: Not on file    Review of Systems  Constitutional: Negative.   HENT: Negative.    Eyes: Negative.   Respiratory: Negative.    Cardiovascular: Negative.   Gastrointestinal: Negative.   Genitourinary: Negative.   Musculoskeletal:  Positive for joint pain (left shoulder).  Skin: Negative.   Neurological: Negative.   Psychiatric/Behavioral: Negative.        Objective    BP 136/70 (BP Location: Right Arm, Cuff Size: Normal)   Pulse 78   Temp (!) 97.4 F (36.3 C) (Temporal)   Ht 5\' 1"  (1.549 m)   Wt 140 lb (63.5 kg)   SpO2 96%   BMI 26.45 kg/m   Physical Exam Vitals and nursing note reviewed.  Constitutional:      General: She is not in acute distress.    Appearance: Normal appearance.  HENT:     Head: Normocephalic and atraumatic.     Right Ear: Tympanic membrane, ear canal and external ear normal.     Left Ear: Tympanic membrane, ear canal and external ear normal.  Eyes:     Conjunctiva/sclera: Conjunctivae normal.  Cardiovascular:     Rate and Rhythm: Normal rate and regular rhythm.     Pulses: Normal pulses.     Heart sounds: Normal heart sounds.  Pulmonary:     Effort: Pulmonary effort is normal.     Breath sounds: Normal breath sounds.  Abdominal:     Palpations: Abdomen is soft.     Tenderness: There is no abdominal tenderness.  Musculoskeletal:        General: No swelling or tenderness. Normal range of motion.     Cervical back: Normal range of motion and neck supple. No tenderness.     Right lower leg: No edema.     Left lower leg: No edema.  Lymphadenopathy:     Cervical: No cervical adenopathy.  Skin:    General: Skin is warm and dry.  Neurological:     General: No focal deficit present.     Mental Status: She is alert and oriented to person, place, and time.  Psychiatric:        Mood and Affect: Mood normal.        Behavior: Behavior normal.        Thought Content: Thought content normal.        Judgment: Judgment normal.    Diabetic Foot Exam - Simple   Simple Foot Form Visual Inspection No deformities, no ulcerations, no other skin breakdown bilaterally: Yes Sensation Testing Intact to touch and monofilament testing bilaterally: Yes Pulse Check Posterior Tibialis and Dorsalis pulse intact bilaterally: Yes Comments      Assessment & Plan:   Problem List Items Addressed This Visit        Cardiovascular and Mediastinum   Hypertension associated with diabetes (HCC)    Chronic, stable.  We will refill her lisinopril-hydrochlorothiazide 10-12.5 mg daily.  Check CMP, CBC.  Follow-up in 6 months.      Relevant Medications   pravastatin (PRAVACHOL) 40 MG tablet   lisinopril-hydrochlorothiazide (ZESTORETIC) 10-12.5 MG tablet   glipiZIDE (GLUCOTROL XL) 10 MG 24 hr tablet     Endocrine  DM type 2 (diabetes mellitus, type 2) ([place]) - Primary    She has been out of her medications for the past week.  We will check A1c today.  She has been checking her blood sugars at home and they have ranged from 100-1 20.  She denies any low blood sugars.  We will continue her glipizide 10 mg daily.  We will also restart her on pravastatin 40 mg daily.  We will check A1c, CMP, CBC, urine microalbumin today.  Follow-up in 3 months.      Relevant Medications   pravastatin (PRAVACHOL) 40 MG tablet   lisinopril-hydrochlorothiazide (ZESTORETIC) 10-12.5 MG tablet   glipiZIDE (GLUCOTROL XL) 10 MG 24 hr tablet   Other Relevant Orders   CBC with Differential/Platelet   Comprehensive metabolic panel   Hemoglobin A1c   Lipid panel   Microalbumin / creatinine urine ratio (Completed)   Other Visit Diagnoses     Postmenopausal estrogen deficiency       DEXA scan ordered today   Relevant Orders   DG Bone Density   Encounter for screening mammogram for malignant neoplasm of breast       Mammogram ordered today   Relevant Orders   MM 3D SCREEN BREAST BILATERAL   Encounter for hepatitis C screening test for low risk patient       Screen for hepatitis C today   Relevant Orders   Hepatitis C antibody   Screen for colon cancer       Cologuard ordered today   Relevant Orders   Cologuard       Return in about 3 months (around 02/13/2022) for Diabetes, HTN.   Charyl Dancer, NP

## 2021-11-14 LAB — HEPATITIS C ANTIBODY: Hepatitis C Ab: NONREACTIVE

## 2021-11-15 NOTE — Progress Notes (Signed)
Called al phone numbers listed in chart demographics and none were successful. Letter will be sent to pt last known address on file. Sw, cma

## 2022-01-29 NOTE — Progress Notes (Unsigned)
   Established Patient Office Visit  Subjective   Patient ID: Julia Porter, female    DOB: [birth year]  Age: 71 y.o. MRN: [id number]  No chief complaint on file.   HPI  Julia Porter is here to follow-up on diabetes.  DIABETES  Hypoglycemic episodes:no Polydipsia/polyuria: no Visual disturbance: no Chest pain: no Paresthesias: no Glucose Monitoring: yes  Accucheck frequency: Daily  Fasting glucose: 150s Taking Insulin?: no Blood Pressure Monitoring: not checking Retinal Examination: Not up to Date Foot Exam: Up to Date Diabetic Education: Completed Pneumovax: Not up to Date Influenza: Not up to Date Aspirin: no  {History (Optional):23778}  ROS    Objective:     BP (!) 150/76   Pulse 75   Temp (!) 95.6 F (35.3 C) (Temporal)   Wt 143 lb 9.6 oz (65.1 kg)   SpO2 99%   BMI 27.13 kg/m  {Vitals History (Optional):23777}  Physical Exam   No results found for any visits on 01/30/22.  {Labs (Optional):23779}  The 10-year ASCVD risk score (Arnett DK, et al., 2019) is: 30.2%    Assessment & Plan:   Problem List Items Addressed This Visit       Endocrine   DM type 2 (diabetes mellitus, type 2) ([place]) - Primary   Relevant Medications   pioglitazone (ACTOS) 30 MG tablet   Other Relevant Orders   POCT glycosylated hemoglobin (Hb A1C)    No follow-ups on file.    Charyl Dancer, NP

## 2022-01-30 ENCOUNTER — Encounter: Payer: Self-pay | Admitting: Nurse Practitioner

## 2022-01-30 ENCOUNTER — Ambulatory Visit (INDEPENDENT_AMBULATORY_CARE_PROVIDER_SITE_OTHER): Payer: Medicare HMO | Admitting: Nurse Practitioner

## 2022-01-30 VITALS — BP 130/70 | HR 75 | Temp 95.6°F | Wt 143.6 lb

## 2022-01-30 DIAGNOSIS — I152 Hypertension secondary to endocrine disorders: Secondary | ICD-10-CM

## 2022-01-30 DIAGNOSIS — E119 Type 2 diabetes mellitus without complications: Secondary | ICD-10-CM | POA: Diagnosis not present

## 2022-01-30 DIAGNOSIS — Z23 Encounter for immunization: Secondary | ICD-10-CM | POA: Diagnosis not present

## 2022-01-30 DIAGNOSIS — Z1211 Encounter for screening for malignant neoplasm of colon: Secondary | ICD-10-CM

## 2022-01-30 DIAGNOSIS — E1159 Type 2 diabetes mellitus with other circulatory complications: Secondary | ICD-10-CM | POA: Diagnosis not present

## 2022-01-30 LAB — POCT GLYCOSYLATED HEMOGLOBIN (HGB A1C): Hemoglobin A1C: 7.3 % — AB (ref 4.0–5.6)

## 2022-01-30 MED ORDER — GLIPIZIDE ER 10 MG PO TB24: 10.0000 mg | ORAL_TABLET | Freq: Every day | ORAL | 1 refills | Status: DC

## 2022-01-30 MED ORDER — LISINOPRIL-HYDROCHLOROTHIAZIDE 10-12.5 MG PO TABS: 1.0000 | ORAL_TABLET | Freq: Every day | ORAL | 1 refills | Status: DC

## 2022-01-30 MED ORDER — PRAVASTATIN SODIUM 40 MG PO TABS: 40.0000 mg | ORAL_TABLET | Freq: Every day | ORAL | 1 refills | Status: DC

## 2022-01-30 NOTE — Patient Instructions (Signed)
It was great to see you!  I have placed an order for screening mammogram, they should call you to schedule. If you do not hear from them in the next week, please call:  [facility], [place], [place]  I have ordered a cologuard test to look for colon cancer. This kit will get mailed to your house with instructions.   Let's follow-up in 6 months, sooner if you have concerns.  If a referral was placed today, you will be contacted for an appointment. Please note that routine referrals can sometimes take up to 3-4 weeks to process. Please call our office if you haven't heard anything after this time frame.  Take care,  Vance Peper, NP

## 2022-01-31 NOTE — Assessment & Plan Note (Signed)
Chronic, stable.  A1c today 7.3%.  She is up-to-date on her foot exam.  She still needs to get an eye exam, referral placed to ophthalmology.  Continue glipizide 10 mg daily.  Follow-up in 6 months.

## 2022-01-31 NOTE — Assessment & Plan Note (Signed)
Chronic, stable.  Continue lisinopril-hydrochlorothiazide 10-12.5 mg daily.  Follow-up in 6 months.

## 2022-02-13 ENCOUNTER — Ambulatory Visit: Payer: Medicare HMO | Admitting: Nurse Practitioner

## 2022-06-06 ENCOUNTER — Telehealth: Payer: Self-pay | Admitting: Nurse Practitioner

## 2022-06-06 NOTE — Telephone Encounter (Signed)
Tried calling both #'s schedule Medicare Annual Wellness Visit (AWV) either virtually or in office. my jabber number [phone]  No answer    DUE 10/28/2017 AWVI PER [place] please schedule with Nurse Health Adviser   45 min for awv-i  in office appointments 30 min for awv-s & awv-i phone/virtual appointments

## 2022-07-26 DIAGNOSIS — E782 Mixed hyperlipidemia: Secondary | ICD-10-CM | POA: Insufficient documentation

## 2022-07-26 DIAGNOSIS — Z0000 Encounter for general adult medical examination without abnormal findings: Secondary | ICD-10-CM | POA: Insufficient documentation

## 2022-07-26 NOTE — Progress Notes (Deleted)
There were no vitals taken for this visit.   Subjective:    Patient ID: Julia Porter, female    DOB: 1951-04-03, 72 y.o.   MRN: [id number]  CC: No chief complaint on file.   HPI: Julia Porter is a 72 y.o. female presenting on 07/30/2022 for comprehensive medical examination. Current medical complaints include:{Blank single:19197::"none","***"}  She currently lives with: Menopausal Symptoms: {Blank single:19197::"yes","no"}  Depression and Anxiety Screen done today and results listed below:     11/13/2021   10:16 AM  Depression screen PHQ 2/9  Decreased Interest 0  Down, Depressed, Hopeless 0  PHQ - 2 Score 0       No data to display          The patient {has/does not have:19849} a history of falls. I {did/did not:19850} complete a risk assessment for falls. A plan of care for falls {was/was not:19852} documented.   Past Medical History:  Past Medical History:  Diagnosis Date   Diabetes mellitus without complication ([place])    Hypertension     Surgical History:  No past surgical history on file.  Medications:  Current Outpatient Medications on File Prior to Visit  Medication Sig   ACCU-CHEK GUIDE test strip 4 (four) times daily. for testing   diclofenac Sodium (VOLTAREN ARTHRITIS PAIN) 1 % GEL Apply 2 g topically 4 (four) times daily.   glipiZIDE (GLUCOTROL XL) 10 MG 24 hr tablet Take 1 tablet (10 mg total) by mouth daily.   lisinopril-hydrochlorothiazide (ZESTORETIC) 10-12.5 MG tablet Take 1 tablet by mouth daily. For blood pressure   Misc. Devices (PULSE OXIMETER) MISC 1 each by Does not apply route as needed.   pravastatin (PRAVACHOL) 40 MG tablet Take 1 tablet (40 mg total) by mouth daily.   No current facility-administered medications on file prior to visit.    Allergies:  No Known Allergies  Social History:  Social History   Socioeconomic History   Marital status: Married    Spouse name: Not on file   Number of children: Not on file   Years of education: Not  on file   Highest education level: Not on file  Occupational History   Not on file  Tobacco Use   Smoking status: Never   Smokeless tobacco: Never  Vaping Use   Vaping Use: Never used  Substance and Sexual Activity   Alcohol use: Never   Drug use: Never   Sexual activity: Not on file  Other Topics Concern   Not on file  Social History Narrative   Not on file   Social Determinants of Health   Financial Resource Strain: Not on file  Food Insecurity: Not on file  Transportation Needs: Not on file  Physical Activity: Not on file  Stress: Not on file  Social Connections: Not on file  Intimate Partner Violence: Not on file   Social History   Tobacco Use  Smoking Status Never  Smokeless Tobacco Never   Social History   Substance and Sexual Activity  Alcohol Use Never    Family History:  No family history on file.  Past medical history, surgical history, medications, allergies, family history and social history reviewed with patient today and changes made to appropriate areas of the chart.   ROS All other ROS negative except what is listed above and in the HPI.      Objective:    There were no vitals taken for this visit.  Wt Readings from Last 3 Encounters:  01/30/22 143 lb  9.6 oz (65.1 kg)  11/13/21 140 lb (63.5 kg)  12/21/18 130 lb (59 kg)    Physical Exam  Results for orders placed or performed in visit on 01/30/22  POCT glycosylated hemoglobin (Hb A1C)  Result Value Ref Range   Hemoglobin A1C 7.3 (A) 4.0 - 5.6 %      Assessment & Plan:   Problem List Items Addressed This Visit       Cardiovascular and Mediastinum   Hypertension associated with diabetes ([place])     Endocrine   DM type 2 (diabetes mellitus, type 2) ([place])     Other   Anemia   Mixed hyperlipidemia   Routine general medical examination at a health care facility - Primary     Follow up plan: No follow-ups on file.   LABORATORY TESTING:  - Pap smear: not  applicable  IMMUNIZATIONS:   - Tdap: Tetanus vaccination status reviewed: last tetanus booster within 10 years. - Influenza: Up to date - Pneumovax: Not applicable - Prevnar: Up to date - HPV: Not applicable - Zostavax vaccine: {Blank single:19197::"Up to date","Administered today","Not applicable","Refused","Given elsewhere"}  SCREENING: -Mammogram: Ordered today  - Colonoscopy:  ordered last visit, discussed today   - Bone Density:  ordered last visit, discussed today    PATIENT COUNSELING:   Advised to take 1 mg of folate supplement per day if capable of pregnancy.   Sexuality: Discussed sexually transmitted diseases, partner selection, use of condoms, avoidance of unintended pregnancy  and contraceptive alternatives.   Advised to avoid cigarette smoking.  I discussed with the patient that most people either abstain from alcohol or drink within safe limits (<=14/week and <=4 drinks/occasion for males, <=7/weeks and <= 3 drinks/occasion for females) and that the risk for alcohol disorders and other health effects rises proportionally with the number of drinks per week and how often a drinker exceeds daily limits.  Discussed cessation/primary prevention of drug use and availability of treatment for abuse.   Diet: Encouraged to adjust caloric intake to maintain  or achieve ideal body weight, to reduce intake of dietary saturated fat and total fat, to limit sodium intake by avoiding high sodium foods and not adding table salt, and to maintain adequate dietary potassium and calcium preferably from fresh fruits, vegetables, and low-fat dairy products.    stressed the importance of regular exercise  Injury prevention: Discussed safety belts, safety helmets, smoke detector, smoking near bedding or upholstery.   Dental health: Discussed importance of regular tooth brushing, flossing, and dental visits.    NEXT PREVENTATIVE PHYSICAL DUE IN 1 YEAR. No follow-ups on file.

## 2022-07-30 ENCOUNTER — Encounter: Payer: Medicare HMO | Admitting: Nurse Practitioner

## 2022-07-30 ENCOUNTER — Telehealth: Payer: Self-pay | Admitting: Nurse Practitioner

## 2022-07-30 DIAGNOSIS — E782 Mixed hyperlipidemia: Secondary | ICD-10-CM

## 2022-07-30 DIAGNOSIS — E119 Type 2 diabetes mellitus without complications: Secondary | ICD-10-CM

## 2022-07-30 DIAGNOSIS — D649 Anemia, unspecified: Secondary | ICD-10-CM

## 2022-07-30 DIAGNOSIS — Z0000 Encounter for general adult medical examination without abnormal findings: Secondary | ICD-10-CM

## 2022-07-30 DIAGNOSIS — E1159 Type 2 diabetes mellitus with other circulatory complications: Secondary | ICD-10-CM

## 2022-07-30 NOTE — Telephone Encounter (Signed)
Pts son called to reschedule for 4/8. Was told of 50.00 fee. Not feeling well.

## 2022-08-02 NOTE — Telephone Encounter (Signed)
1st missed visit, pt was not feeling well and was scheduled for cpe, fee waived, letter sent

## 2022-08-02 NOTE — Telephone Encounter (Signed)
Noted  

## 2022-08-05 ENCOUNTER — Other Ambulatory Visit: Payer: Self-pay | Admitting: Nurse Practitioner

## 2022-08-05 NOTE — Progress Notes (Unsigned)
There were no vitals taken for this visit.   Subjective:    Patient ID: Julia Porter, female    DOB: 26-Jun-1950, 72 y.o.   MRN: [id number]  CC: No chief complaint on file.   HPI: [name] is a 72 y.o. female presenting on 08/06/2022 for comprehensive medical examination. Current medical complaints include:{Blank single:19197::"none","***"}  She currently lives with: Menopausal Symptoms: {Blank single:19197::"yes","no"}  Depression and Anxiety Screen done today and results listed below:     11/13/2021   10:16 AM  Depression screen PHQ 2/9  Decreased Interest 0  Down, Depressed, Hopeless 0  PHQ - 2 Score 0       No data to display          The patient {has/does not have:19849} a history of falls. I {did/did not:19850} complete a risk assessment for falls. A plan of care for falls {was/was not:19852} documented.   Past Medical History:  Past Medical History:  Diagnosis Date   Diabetes mellitus without complication (HCC)    Hypertension     Surgical History:  No past surgical history on file.  Medications:  Current Outpatient Medications on File Prior to Visit  Medication Sig   ACCU-CHEK GUIDE test strip 4 (four) times daily. for testing   diclofenac Sodium (VOLTAREN ARTHRITIS PAIN) 1 % GEL Apply 2 g topically 4 (four) times daily.   glipiZIDE (GLUCOTROL XL) 10 MG 24 hr tablet Take 1 tablet (10 mg total) by mouth daily.   lisinopril-hydrochlorothiazide (ZESTORETIC) 10-12.5 MG tablet Take 1 tablet by mouth daily. For blood pressure   Misc. Devices (PULSE OXIMETER) MISC 1 each by Does not apply route as needed.   pravastatin (PRAVACHOL) 40 MG tablet Take 1 tablet (40 mg total) by mouth daily.   No current facility-administered medications on file prior to visit.    Allergies:  No Known Allergies  Social History:  Social History   Socioeconomic History   Marital status: Married    Spouse name: Not on file   Number of children: Not on file   Years of education: Not  on file   Highest education level: Not on file  Occupational History   Not on file  Tobacco Use   Smoking status: Never   Smokeless tobacco: Never  Vaping Use   Vaping Use: Never used  Substance and Sexual Activity   Alcohol use: Never   Drug use: Never   Sexual activity: Not on file  Other Topics Concern   Not on file  Social History Narrative   Not on file   Social Determinants of Health   Financial Resource Strain: Not on file  Food Insecurity: Not on file  Transportation Needs: Not on file  Physical Activity: Not on file  Stress: Not on file  Social Connections: Not on file  Intimate Partner Violence: Not on file   Social History   Tobacco Use  Smoking Status Never  Smokeless Tobacco Never   Social History   Substance and Sexual Activity  Alcohol Use Never    Family History:  No family history on file.  Past medical history, surgical history, medications, allergies, family history and social history reviewed with patient today and changes made to appropriate areas of the chart.   ROS All other ROS negative except what is listed above and in the HPI.      Objective:    There were no vitals taken for this visit.  Wt Readings from Last 3 Encounters:  01/30/22 143 lb  9.6 oz (65.1 kg)  11/13/21 140 lb (63.5 kg)  12/21/18 130 lb (59 kg)    Physical Exam  Results for orders placed or performed in visit on 01/30/22  POCT glycosylated hemoglobin (Hb A1C)  Result Value Ref Range   Hemoglobin A1C 7.3 (A) 4.0 - 5.6 %      Assessment & Plan:   Problem List Items Addressed This Visit   None    Follow up plan: No follow-ups on file.   LABORATORY TESTING:  - Pap smear: not applicable  IMMUNIZATIONS:   - Tdap: Tetanus vaccination status reviewed: last tetanus booster within 10 years. - Influenza: Up to date - Pneumovax: Up to date - Prevnar: Up to date - HPV: Not applicable - Zostavax vaccine:  discussed today  SCREENING: -Mammogram: Ordered  today  - Colonoscopy: {Blank single:19197::"Up to date","Ordered today","Not applicable","Refused","Done elsewhere"}  - Bone Density: Ordered today   PATIENT COUNSELING:   Advised to take 1 mg of folate supplement per day if capable of pregnancy.   Sexuality: Discussed sexually transmitted diseases, partner selection, use of condoms, avoidance of unintended pregnancy  and contraceptive alternatives.   Advised to avoid cigarette smoking.  I discussed with the patient that most people either abstain from alcohol or drink within safe limits (<=14/week and <=4 drinks/occasion for males, <=7/weeks and <= 3 drinks/occasion for females) and that the risk for alcohol disorders and other health effects rises proportionally with the number of drinks per week and how often a drinker exceeds daily limits.  Discussed cessation/primary prevention of drug use and availability of treatment for abuse.   Diet: Encouraged to adjust caloric intake to maintain  or achieve ideal body weight, to reduce intake of dietary saturated fat and total fat, to limit sodium intake by avoiding high sodium foods and not adding table salt, and to maintain adequate dietary potassium and calcium preferably from fresh fruits, vegetables, and low-fat dairy products.    stressed the importance of regular exercise  Injury prevention: Discussed safety belts, safety helmets, smoke detector, smoking near bedding or upholstery.   Dental health: Discussed importance of regular tooth brushing, flossing, and dental visits.    NEXT PREVENTATIVE PHYSICAL DUE IN 1 YEAR. No follow-ups on file.

## 2022-08-06 ENCOUNTER — Encounter: Payer: Self-pay | Admitting: Nurse Practitioner

## 2022-08-06 ENCOUNTER — Ambulatory Visit (INDEPENDENT_AMBULATORY_CARE_PROVIDER_SITE_OTHER): Payer: Medicare HMO | Admitting: Nurse Practitioner

## 2022-08-06 VITALS — BP 152/70 | HR 72 | Temp 98.0°F | Ht 61.0 in | Wt 141.0 lb

## 2022-08-06 DIAGNOSIS — D649 Anemia, unspecified: Secondary | ICD-10-CM

## 2022-08-06 DIAGNOSIS — I152 Hypertension secondary to endocrine disorders: Secondary | ICD-10-CM | POA: Diagnosis not present

## 2022-08-06 DIAGNOSIS — E782 Mixed hyperlipidemia: Secondary | ICD-10-CM

## 2022-08-06 DIAGNOSIS — E119 Type 2 diabetes mellitus without complications: Secondary | ICD-10-CM

## 2022-08-06 DIAGNOSIS — E1159 Type 2 diabetes mellitus with other circulatory complications: Secondary | ICD-10-CM | POA: Diagnosis not present

## 2022-08-06 DIAGNOSIS — Z0000 Encounter for general adult medical examination without abnormal findings: Secondary | ICD-10-CM | POA: Diagnosis not present

## 2022-08-06 LAB — COMPREHENSIVE METABOLIC PANEL
ALT: 19 U/L (ref 0–35)
AST: 21 U/L (ref 0–37)
Albumin: 4.6 g/dL (ref 3.5–5.2)
Alkaline Phosphatase: 77 U/L (ref 39–117)
BUN: 35 mg/dL — ABNORMAL HIGH (ref 6–23)
CO2: 23 mEq/L (ref 19–32)
Calcium: 9.8 mg/dL (ref 8.4–10.5)
Chloride: 103 mEq/L (ref 96–112)
Creatinine, Ser: 1.41 mg/dL — ABNORMAL HIGH (ref 0.40–1.20)
GFR: 37.53 mL/min — ABNORMAL LOW (ref >60.00)
Glucose, Bld: 83 mg/dL (ref 70–99)
Potassium: 4.3 mEq/L (ref 3.5–5.1)
Sodium: 134 mEq/L — ABNORMAL LOW (ref 135–145)
Total Bilirubin: 0.3 mg/dL (ref 0.2–1.2)
Total Protein: 7.9 g/dL (ref 6.0–8.3)

## 2022-08-06 LAB — VITAMIN B12: Vitamin B-12: 531 pg/mL (ref 211–911)

## 2022-08-06 LAB — CBC
HCT: 33.9 % — ABNORMAL LOW (ref 36.0–46.0)
Hemoglobin: 11.1 g/dL — ABNORMAL LOW (ref 12.0–15.0)
MCHC: 32.7 g/dL (ref 30.0–36.0)
MCV: 77.7 fl — ABNORMAL LOW (ref 78.0–100.0)
Platelets: 341 10*3/uL (ref 150.0–400.0)
RBC: 4.36 Mil/uL (ref 3.87–5.11)
RDW: 13.7 % (ref 11.5–15.5)
WBC: 10.7 10*3/uL — ABNORMAL HIGH (ref 4.0–10.5)

## 2022-08-06 LAB — HEMOGLOBIN A1C: Hgb A1c MFr Bld: 7.5 % — ABNORMAL HIGH (ref 4.6–6.5)

## 2022-08-06 LAB — LIPID PANEL
Cholesterol: 178 mg/dL (ref 0–200)
HDL: 37.4 mg/dL — ABNORMAL LOW (ref >39.00)
NonHDL: 140.73
Total CHOL/HDL Ratio: 5
Triglycerides: 202 mg/dL — ABNORMAL HIGH (ref 0.0–149.0)
VLDL: 40.4 mg/dL — ABNORMAL HIGH (ref 0.0–40.0)

## 2022-08-06 LAB — LDL CHOLESTEROL, DIRECT: Direct LDL: 109 mg/dL

## 2022-08-06 MED ORDER — LISINOPRIL-HYDROCHLOROTHIAZIDE 10-12.5 MG PO TABS: 1.0000 | ORAL_TABLET | Freq: Every day | ORAL | 1 refills | Status: DC

## 2022-08-06 MED ORDER — GLIPIZIDE ER 10 MG PO TB24: 10.0000 mg | ORAL_TABLET | Freq: Every day | ORAL | 1 refills | Status: DC

## 2022-08-06 MED ORDER — PRAVASTATIN SODIUM 40 MG PO TABS: 40.0000 mg | ORAL_TABLET | Freq: Every day | ORAL | 1 refills | Status: DC

## 2022-08-06 NOTE — Assessment & Plan Note (Signed)
Check CBC, iron panel, and vitamin B12 today and treat based on results.

## 2022-08-06 NOTE — Patient Instructions (Signed)
It was great to see you!  We are checking your labs today and will let you know the results via mychart/phone.   I have sent refills your pharmacy.   Let's follow-up in 3 months, sooner if you have concerns.  If a referral was placed today, you will be contacted for an appointment. Please note that routine referrals can sometimes take up to 3-4 weeks to process. Please call our office if you haven't heard anything after this time frame.  Take care,  Rodman Pickle, NP

## 2022-08-06 NOTE — Assessment & Plan Note (Signed)
Health maintenance reviewed and updated. Discussed nutrition, exercise. Check CMP, CBC today. Follow-up 1 year.   

## 2022-08-06 NOTE — Assessment & Plan Note (Signed)
Blood pressure slightly elevated today at 152/70. She has been out of her blood pressure medication for the past 2 days. Refill of lisinopril-hctz 10-12.5mg  daily sent to the pharmacy. Check CMP, CBC, lipid panel today. Follow-up in 3 months.

## 2022-08-06 NOTE — Assessment & Plan Note (Signed)
Chronic, stable. Check CMP, CBC, A1c today. She is up to date on foot exam. Encouraged her to schedule an eye exam. Continue glipizide 10mg  daily. Follow-up in 3 months.

## 2022-08-06 NOTE — Assessment & Plan Note (Signed)
Chronic, stable. Continue pravastatin 40mg  daily. Check CMP, CBC, lipid panel today. Follow-up in 6 months.

## 2022-08-07 LAB — IRON,TIBC AND FERRITIN PANEL
%SAT: 17 % (calc) (ref 16–45)
Ferritin: 114 ng/mL (ref 16–288)
Iron: 62 ug/dL (ref 45–160)
TIBC: 358 mcg/dL (calc) (ref 250–450)

## 2022-08-07 MED ORDER — EMPAGLIFLOZIN 10 MG PO TABS
10.0000 mg | ORAL_TABLET | Freq: Every day | ORAL | 1 refills | Status: DC
Start: 2022-08-07 — End: 2022-11-19

## 2022-08-07 NOTE — Addendum Note (Signed)
Addended by: Rodman Pickle A on: 08/07/2022 07:45 AM   Modules accepted: Orders

## 2022-08-21 ENCOUNTER — Telehealth: Payer: Self-pay | Admitting: Nurse Practitioner

## 2022-08-21 NOTE — Telephone Encounter (Signed)
Called patient to schedule Medicare Annual Wellness Visit (AWV). No voicemail available to leave a message.  Last date of AWV: 10/14/21 per snapshot  Please schedule an appointment at any time with [facility].  If any questions, please contact me at [phone].  Thank you ,  Rudell Cobb AWV direct phone # [phone]

## 2022-09-17 ENCOUNTER — Ambulatory Visit
Admission: RE | Admit: 2022-09-17 | Discharge: 2022-09-17 | Disposition: A | Discharge status: Home / Self Care | Payer: Medicare HMO | Source: Ambulatory Visit | Attending: Nurse Practitioner | Admitting: Nurse Practitioner

## 2022-09-17 DIAGNOSIS — Z1231 Encounter for screening mammogram for malignant neoplasm of breast: Secondary | ICD-10-CM | POA: Diagnosis not present

## 2022-09-20 ENCOUNTER — Encounter: Payer: Self-pay | Admitting: *Deleted

## 2022-09-20 NOTE — Progress Notes (Signed)
  [facility] Quality Team Note  Name: Julia Porter Date of Birth: 07-06-1950 MRN: [id number] Date: 09/20/2022  [facility] Quality Team has reviewed this patient's chart, please see recommendations below:  [facility] Quality Other; Pt has open gaps for colon screening.  Pt refused per 08/06/22 note.  Pt has gap for diabetic retinopathy screening.  Called pt about 09/27/22 eye event but had to leave voice mail.  Pt needs Urine Albumin Creatinine Ratio test in order to close KED gap.  Can your provider order at appt 11/19/2022?  Could provider address gaps if possible?

## 2022-11-19 ENCOUNTER — Encounter: Payer: Self-pay | Admitting: Nurse Practitioner

## 2022-11-19 ENCOUNTER — Other Ambulatory Visit: Payer: Medicare HMO

## 2022-11-19 ENCOUNTER — Ambulatory Visit (INDEPENDENT_AMBULATORY_CARE_PROVIDER_SITE_OTHER): Payer: Medicare HMO | Admitting: Nurse Practitioner

## 2022-11-19 VITALS — BP 144/76 | HR 83 | Temp 97.7°F | Ht 61.0 in | Wt 141.4 lb

## 2022-11-19 DIAGNOSIS — Z1211 Encounter for screening for malignant neoplasm of colon: Secondary | ICD-10-CM | POA: Diagnosis not present

## 2022-11-19 DIAGNOSIS — N1832 Chronic kidney disease, stage 3b: Secondary | ICD-10-CM | POA: Diagnosis not present

## 2022-11-19 DIAGNOSIS — I152 Hypertension secondary to endocrine disorders: Secondary | ICD-10-CM

## 2022-11-19 DIAGNOSIS — E1122 Type 2 diabetes mellitus with diabetic chronic kidney disease: Secondary | ICD-10-CM

## 2022-11-19 DIAGNOSIS — R195 Other fecal abnormalities: Secondary | ICD-10-CM

## 2022-11-19 DIAGNOSIS — Z7984 Long term (current) use of oral hypoglycemic drugs: Secondary | ICD-10-CM | POA: Insufficient documentation

## 2022-11-19 DIAGNOSIS — E1159 Type 2 diabetes mellitus with other circulatory complications: Secondary | ICD-10-CM | POA: Diagnosis not present

## 2022-11-19 DIAGNOSIS — Z780 Asymptomatic menopausal state: Secondary | ICD-10-CM

## 2022-11-19 LAB — BASIC METABOLIC PANEL
BUN: 32 mg/dL — ABNORMAL HIGH (ref 6–23)
CO2: 23 mEq/L (ref 19–32)
Calcium: 10.1 mg/dL (ref 8.4–10.5)
Chloride: 102 mEq/L (ref 96–112)
Creatinine, Ser: 1.33 mg/dL — ABNORMAL HIGH (ref 0.40–1.20)
GFR: 40.18 mL/min — ABNORMAL LOW (ref >60.00)
Glucose, Bld: 237 mg/dL — ABNORMAL HIGH (ref 70–99)
Potassium: 4.3 mEq/L (ref 3.5–5.1)
Sodium: 136 mEq/L (ref 135–145)

## 2022-11-19 LAB — MICROALBUMIN / CREATININE URINE RATIO
Creatinine,U: 35.5 mg/dL
Microalb Creat Ratio: 3.1 mg/g (ref 0.0–30.0)
Microalb, Ur: 1.1 mg/dL (ref 0.0–1.9)

## 2022-11-19 MED ORDER — EMPAGLIFLOZIN 10 MG PO TABS: 10.0000 mg | ORAL_TABLET | Freq: Every day | ORAL | 0 refills | Status: DC

## 2022-11-19 NOTE — Assessment & Plan Note (Signed)
Chronic, stable.  Most recent GFR was 37.  She is already taking lisinopril 10 mg daily for kidney protection and will add her on Jardiance 10 mg daily to help with kidney protection and blood sugars.  Check BMP today.  Follow-up in 3 months.

## 2022-11-19 NOTE — Assessment & Plan Note (Signed)
Continue glipizide XL 10 mg daily and start Jardiance 10 mg daily.

## 2022-11-19 NOTE — Assessment & Plan Note (Signed)
Chronic, stable. Check BMP, A1c, urine microalbumin today. Foot exam done and normal.  We requested for her to be on a diabetic eye exam on 02/14/2023, waiting to hear out when she is scheduled.  Continue glipizide 10 mg daily and start Jardiance 10 mg daily.  Continue checking blood sugars at home.  Follow-up in 3 months.

## 2022-11-19 NOTE — Assessment & Plan Note (Signed)
Blood pressure slightly elevated today at 144/76. She did not take her blood pressure medication this morning. Continue lisionopril-hydrochlorothiazide 10-12.5mg  daily. Follow-up in 3 months.

## 2022-11-19 NOTE — Patient Instructions (Signed)
It was great to see you!  Start jardiance 1 tablet daily for your blood sugars and protect your kidneys.   We are checking your labs today and will let you know the results via mychart/phone.   We will call when you get scheduled for the eye exam on 10/17.   I have placed an order for screening dexa scan, they should call you to schedule. If you do not hear from them in the next week, please call:  [facility] [address] [place], [address] [place], [place] [phone]  I have ordered the cologuard kit and it will be mailed to your house.  Let's follow-up in 3 months, sooner if you have concerns.  If a referral was placed today, you will be contacted for an appointment. Please note that routine referrals can sometimes take up to 3-4 weeks to process. Please call our office if you haven't heard anything after this time frame.  Take care,  Rodman Pickle, NP

## 2022-11-19 NOTE — Progress Notes (Signed)
Established Patient Office Visit  Subjective   Patient ID: Julia Porter, female    DOB: [birth year]  Age: 72 y.o. MRN: [id number]  Chief Complaint  Patient presents with   Diabetes and HTN    Follow up and urine ACR   Visit completed with interpreter in person.   HPI  Julia Porter is here to follow-up on diabetes and chronic kidney disease.   DIABETES  Hypoglycemic episodes:no Polydipsia/polyuria: no Visual disturbance: no Chest pain: no Paresthesias: no Glucose Monitoring: yes  Accucheck frequency: Daily  Fasting glucose: doesn't remember numbers Taking Insulin?: no Blood Pressure Monitoring: not checking Retinal Examination: Not up to Date Foot Exam: Not up to Date Diabetic Education: Completed Pneumovax: Up to Date Influenza: Up to Date Aspirin: no     ROS See pertinent positives and negatives per HPI.    Objective:     BP (!) 144/76 (BP Location: Left Arm, Cuff Size: Normal)   Pulse 83   Temp 97.7 F (36.5 C)   Ht 5\' 1"  (1.549 m)   Wt 141 lb 6.4 oz (64.1 kg)   SpO2 98%   BMI 26.72 kg/m    Physical Exam Vitals and nursing note reviewed.  Constitutional:      General: She is not in acute distress.    Appearance: Normal appearance.  HENT:     Head: Normocephalic.  Eyes:     Conjunctiva/sclera: Conjunctivae normal.  Cardiovascular:     Rate and Rhythm: Normal rate and regular rhythm.     Pulses: Normal pulses.     Heart sounds: Normal heart sounds.  Pulmonary:     Effort: Pulmonary effort is normal.     Breath sounds: Normal breath sounds.  Musculoskeletal:     Cervical back: Normal range of motion.  Skin:    General: Skin is warm.  Neurological:     General: No focal deficit present.     Mental Status: She is alert and oriented to person, place, and time.  Psychiatric:        Mood and Affect: Mood normal.        Behavior: Behavior normal.        Thought Content: Thought content normal.        Judgment: Judgment normal.    Diabetic Foot Exam  - Simple   Simple Foot Form Visual Inspection No deformities, no ulcerations, no other skin breakdown bilaterally: Yes Sensation Testing Intact to touch and monofilament testing bilaterally: Yes Pulse Check Posterior Tibialis and Dorsalis pulse intact bilaterally: Yes Comments     No results found for any visits on 11/19/22.    The 10-year ASCVD risk score (Arnett DK, et al., 2019) is: 31.7%    Assessment & Plan:   Problem List Items Addressed This Visit       Cardiovascular and Mediastinum   Hypertension associated with diabetes (HCC)    Blood pressure slightly elevated today at 144/76. She did not take her blood pressure medication this morning. Continue lisionopril-hydrochlorothiazide 10-12.5mg  daily. Follow-up in 3 months.       Relevant Medications   empagliflozin (JARDIANCE) 10 MG TABS tablet     Endocrine   DM type 2 (diabetes mellitus, type 2) (HCC)    Chronic, stable. Check BMP, A1c, urine microalbumin today. Foot exam done and normal.  We requested for her to be on a diabetic eye exam on 02/14/2023, waiting to hear out when she is scheduled.  Continue glipizide 10 mg daily and start Jardiance 10 mg  daily.  Continue checking blood sugars at home.  Follow-up in 3 months.      Relevant Medications   empagliflozin (JARDIANCE) 10 MG TABS tablet   Other Relevant Orders   Basic metabolic panel   Microalbumin / creatinine urine ratio   Hemoglobin A1c     Genitourinary   Stage 3b chronic kidney disease (HCC) - Primary    Chronic, stable.  Most recent GFR was 37.  She is already taking lisinopril 10 mg daily for kidney protection and will add her on Jardiance 10 mg daily to help with kidney protection and blood sugars.  Check BMP today.  Follow-up in 3 months.      Relevant Orders   Basic metabolic panel     Other   Long term current use of oral hypoglycemic drug    Continue glipizide XL 10 mg daily and start Jardiance 10 mg daily.      Other Visit Diagnoses      Screen for colon cancer       Cologuard ordered today   Relevant Orders   Cologuard   Postmenopausal estrogen deficiency       Dexa scan ordered today   Relevant Orders   DG Bone Density       Return in about 3 months (around 02/19/2023) for Diabetes.    Gerre Scull, NP

## 2022-11-20 LAB — HEMOGLOBIN A1C
Hgb A1c MFr Bld: 7.1 % of total Hgb — ABNORMAL HIGH (ref <5.7)
Mean Plasma Glucose: 157 mg/dL
eAG (mmol/L): 8.7 mmol/L

## 2022-11-22 DIAGNOSIS — Z1211 Encounter for screening for malignant neoplasm of colon: Secondary | ICD-10-CM | POA: Diagnosis not present

## 2022-12-02 NOTE — Addendum Note (Signed)
Addended by: Rodman Pickle A on: 12/02/2022 10:58 AM   Modules accepted: Orders

## 2023-01-28 ENCOUNTER — Other Ambulatory Visit: Payer: Self-pay | Admitting: Nurse Practitioner

## 2023-02-14 ENCOUNTER — Encounter: Payer: Self-pay | Admitting: Nurse Practitioner

## 2023-02-14 ENCOUNTER — Ambulatory Visit: Payer: Medicare HMO | Admitting: Nurse Practitioner

## 2023-02-14 VITALS — BP 144/80 | HR 72 | Temp 96.3°F | Resp 18 | Wt 142.4 lb

## 2023-02-14 DIAGNOSIS — Z7984 Long term (current) use of oral hypoglycemic drugs: Secondary | ICD-10-CM | POA: Diagnosis not present

## 2023-02-14 DIAGNOSIS — Z23 Encounter for immunization: Secondary | ICD-10-CM

## 2023-02-14 DIAGNOSIS — E1122 Type 2 diabetes mellitus with diabetic chronic kidney disease: Secondary | ICD-10-CM

## 2023-02-14 DIAGNOSIS — N1832 Type 2 diabetes mellitus with diabetic chronic kidney disease: Secondary | ICD-10-CM

## 2023-02-14 LAB — POCT GLYCOSYLATED HEMOGLOBIN (HGB A1C)
HbA1c POC (<> result, manual entry): 7 % — AB (ref 4.0–5.6)
HbA1c, POC (controlled diabetic range): 7 % — AB (ref 0.0–7.0)
HbA1c, POC (prediabetic range): 7 % — AB (ref 5.7–6.4)
Hemoglobin A1C: 7 % — AB (ref 4.0–5.6)

## 2023-02-14 LAB — HM DIABETES EYE EXAM

## 2023-02-14 MED ORDER — GLIPIZIDE ER 10 MG PO TB24: 10.0000 mg | ORAL_TABLET | Freq: Every day | ORAL | 1 refills | Status: DC

## 2023-02-14 MED ORDER — PRAVASTATIN SODIUM 40 MG PO TABS: 40.0000 mg | ORAL_TABLET | Freq: Every day | ORAL | 1 refills | Status: DC

## 2023-02-14 MED ORDER — LISINOPRIL-HYDROCHLOROTHIAZIDE 10-12.5 MG PO TABS: 1.0000 | ORAL_TABLET | Freq: Every day | ORAL | 1 refills | Status: DC

## 2023-02-14 MED ORDER — EMPAGLIFLOZIN 10 MG PO TABS: 10.0000 mg | ORAL_TABLET | Freq: Every day | ORAL | 1 refills | Status: DC

## 2023-02-14 NOTE — Progress Notes (Signed)
Established Patient Office Visit  Subjective   Patient ID: Julia Porter, female    DOB: [birth year]  Age: 72 y.o. MRN: [id number]  Chief Complaint  Patient presents with   office visit     PT is here for 3 month follow up for diabetes. Rx refills are needed    Visit completed with in person interpreter  HPI  Julia Porter is here to follow-up on diabetes.   DIABETES  Hypoglycemic episodes:no Polydipsia/polyuria: no Visual disturbance: no Chest pain: no Paresthesias: yes - left leg Glucose Monitoring: yes  Accucheck frequency: Daily - 120s-130s Taking Insulin?: no Blood Pressure Monitoring: daily Retinal Examination:  scheduled today Foot Exam: Not up to Date Diabetic Education: Completed Pneumovax: Up to Date Influenza:  given today Aspirin: no     ROS See pertinent positives and negatives per HPI.    Objective:     BP (!) 144/80 (BP Location: Left Arm, Cuff Size: Large)   Pulse 72   Temp (!) 96.3 F (35.7 C) (Temporal)   Resp 18   Wt 142 lb 6.4 oz (64.6 kg)   SpO2 100%   BMI 26.91 kg/m    Physical Exam Vitals and nursing note reviewed.  Constitutional:      General: She is not in acute distress.    Appearance: Normal appearance.  HENT:     Head: Normocephalic.  Eyes:     Conjunctiva/sclera: Conjunctivae normal.  Cardiovascular:     Rate and Rhythm: Normal rate and regular rhythm.     Pulses: Normal pulses.     Heart sounds: Normal heart sounds.  Pulmonary:     Effort: Pulmonary effort is normal.     Breath sounds: Normal breath sounds.  Musculoskeletal:     Cervical back: Normal range of motion.  Skin:    General: Skin is warm.  Neurological:     General: No focal deficit present.     Mental Status: She is alert and oriented to person, place, and time.  Psychiatric:        Mood and Affect: Mood normal.        Behavior: Behavior normal.        Thought Content: Thought content normal.        Judgment: Judgment normal.      Results for orders  placed or performed in visit on 02/14/23  POCT glycosylated hemoglobin (Hb A1C)  Result Value Ref Range   Hemoglobin A1C 7.0 (A) 4.0 - 5.6 %   HbA1c POC (<> result, manual entry) 7.0 (A) 4.0 - 5.6 %   HbA1c, POC (prediabetic range) 7.0 (A) 5.7 - 6.4 %   HbA1c, POC (controlled diabetic range) 7.0 (A) 0.0 - 7.0 %      The 10-year ASCVD risk score (Arnett DK, et al., 2019) is: 34.4%    Assessment & Plan:   Problem List Items Addressed This Visit       Endocrine   DM type 2 (diabetes mellitus, type 2) (HCC) - Primary    Chronic, stable.  A1c today is 7%.  Continue glipizide XL 10 mg daily, Jardiance 10 mg daily.  She is getting her eye exam today.  Foot exam normal.  Flu vaccine given today.  Up-to-date on pneumonia vaccine.  Follow-up in 3 months.      Relevant Medications   empagliflozin (JARDIANCE) 10 MG TABS tablet   glipiZIDE (GLUCOTROL XL) 10 MG 24 hr tablet   lisinopril-hydrochlorothiazide (ZESTORETIC) 10-12.5 MG tablet   pravastatin (PRAVACHOL) 40  MG tablet   Other Relevant Orders   POCT glycosylated hemoglobin (Hb A1C) (Completed)     Other   Long term current use of oral hypoglycemic drug    Continue glipizide XL 10 mg daily and start Jardiance 10 mg daily.      Other Visit Diagnoses     Immunization due       Flu vaccine given today   Relevant Orders   Flu Vaccine Trivalent High Dose (Fluad) (Completed)       Return in about 3 months (around 05/17/2023) for Diabetes.    Julia Scull, NP

## 2023-02-14 NOTE — Assessment & Plan Note (Signed)
Chronic, stable.  A1c today is 7%.  Continue glipizide XL 10 mg daily, Jardiance 10 mg daily.  She is getting her eye exam today.  Foot exam normal.  Flu vaccine given today.  Up-to-date on pneumonia vaccine.  Follow-up in 3 months.

## 2023-02-14 NOTE — Assessment & Plan Note (Signed)
Continue glipizide XL 10 mg daily and start Jardiance 10 mg daily.

## 2023-02-14 NOTE — Patient Instructions (Signed)
It was great to see you!  Please call the stomach doctor, [facility] to schedule an appointment and colonoscopy:  Address: [address], [place], [place] [address] Phone: [phone]  Keep up the great work  Let's follow-up in 3 months, sooner if you have concerns.  If a referral was placed today, you will be contacted for an appointment. Please note that routine referrals can sometimes take up to 3-4 weeks to process. Please call our office if you haven't heard anything after this time frame.  Take care,  Rodman Pickle, NP

## 2023-02-20 ENCOUNTER — Telehealth: Payer: Self-pay

## 2023-02-20 ENCOUNTER — Encounter: Payer: Self-pay | Admitting: Nurse Practitioner

## 2023-02-20 NOTE — Telephone Encounter (Signed)
Results received. Report sent to HIM to be scanned into pt chart. Provider notified. Pt notified.

## 2023-02-21 ENCOUNTER — Encounter: Payer: Self-pay | Admitting: Nurse Practitioner

## 2023-02-22 ENCOUNTER — Ambulatory Visit (INDEPENDENT_AMBULATORY_CARE_PROVIDER_SITE_OTHER): Payer: Medicare HMO

## 2023-02-22 VITALS — BP 138/60 | HR 84 | Temp 98.1°F | Ht 61.5 in | Wt 140.2 lb

## 2023-02-22 DIAGNOSIS — Z0000 Encounter for general adult medical examination without abnormal findings: Secondary | ICD-10-CM

## 2023-02-22 NOTE — Patient Instructions (Addendum)
Ms. Molenaar , Thank you for taking time to come for your Medicare Wellness Visit. I appreciate your ongoing commitment to your health goals. Please review the following plan we discussed and let me know if I can assist you in the future.   Referrals/Orders/Follow-Ups/Clinician Recommendations: none  This is a list of the screening recommended for you and due dates:  Health Maintenance  Topic Date Due   Colon Cancer Screening  Never done   Zoster (Shingles) Vaccine (1 of 2) Never done   DEXA scan (bone density measurement)  Never done   COVID-19 Vaccine (1 - 2023-24 season) 03/02/2023*   Hemoglobin A1C  08/15/2023   Yearly kidney function blood test for diabetes  11/19/2023   Yearly kidney health urinalysis for diabetes  11/19/2023   Complete foot exam   02/14/2024   Eye exam for diabetics  02/14/2024   Medicare Annual Wellness Visit  02/22/2024   Mammogram  09/16/2024   DTaP/Tdap/Td vaccine (2 - Tdap) 11/13/2029   Pneumonia Vaccine  Completed   Flu Shot  Completed   Hepatitis C Screening  Completed   HPV Vaccine  Aged Out  *Topic was postponed. The date shown is not the original due date.    Advanced directives: (Declined) Advance directive discussed with you today. Even though you declined this today, please call our office should you change your mind, and we can give you the proper paperwork for you to fill out.  Next Medicare Annual Wellness Visit scheduled for next year: Yes  Insert Preventive Care attachment Insert FALL PREVENTION attachment if needed

## 2023-02-22 NOTE — Progress Notes (Signed)
Subjective:   Julia Porter is a 72 y.o. female who presents for an Initial Medicare Annual Wellness Visit.  Visit Complete: In person    Cardiac Risk Factors include: advanced age (>51men, >80 women);diabetes mellitus;dyslipidemia;hypertension     Objective:    Today's Vitals   02/22/23 1110  BP: 138/60  Pulse: 84  Temp: 98.1 F (36.7 C)  TempSrc: Oral  SpO2: 94%  Weight: 140 lb 3.2 oz (63.6 kg)  Height: 5' 1.5" (1.562 m)   Body mass index is 26.06 kg/m.     02/22/2023   11:22 AM 12/20/2018   11:31 PM  Advanced Directives  Does Patient Have a Medical Advance Directive? No No  Would patient like information on creating a medical advance directive?  No - Patient declined    Current Medications (verified) Outpatient Encounter Medications as of 02/22/2023  Medication Sig   ACCU-CHEK GUIDE test strip 4 (four) times daily. for testing   diclofenac Sodium (VOLTAREN ARTHRITIS PAIN) 1 % GEL Apply 2 g topically 4 (four) times daily.   empagliflozin (JARDIANCE) 10 MG TABS tablet Take 1 tablet (10 mg total) by mouth daily before breakfast.   glipiZIDE (GLUCOTROL XL) 10 MG 24 hr tablet Take 1 tablet (10 mg total) by mouth daily with breakfast.   lisinopril-hydrochlorothiazide (ZESTORETIC) 10-12.5 MG tablet Take 1 tablet by mouth daily. For blood pressure   Misc. Devices (PULSE OXIMETER) MISC 1 each by Does not apply route as needed.   pravastatin (PRAVACHOL) 40 MG tablet Take 1 tablet (40 mg total) by mouth daily.   No facility-administered encounter medications on file as of 02/22/2023.    Allergies (verified) Patient has no known allergies.   History: Past Medical History:  Diagnosis Date   Diabetes mellitus without complication (HCC)    Hypertension    History reviewed. No pertinent surgical history. History reviewed. No pertinent family history. Social History   Socioeconomic History   Marital status: Married    Spouse name: Not on file   Number of children: Not  on file   Years of education: Not on file   Highest education level: Not on file  Occupational History   Not on file  Tobacco Use   Smoking status: Never   Smokeless tobacco: Never  Vaping Use   Vaping status: Never Used  Substance and Sexual Activity   Alcohol use: Never   Drug use: Never   Sexual activity: Not on file  Other Topics Concern   Not on file  Social History Narrative   Not on file   Social Determinants of Health   Financial Resource Strain: Low Risk  (02/22/2023)   Overall Financial Resource Strain (CARDIA)    Difficulty of Paying Living Expenses: Not hard at all  Food Insecurity: No Food Insecurity (02/22/2023)   Hunger Vital Sign    Worried About Running Out of Food in the Last Year: Never true    Ran Out of Food in the Last Year: Never true  Transportation Needs: No Transportation Needs (02/22/2023)   PRAPARE - Administrator, Civil Service (Medical): No    Lack of Transportation (Non-Medical): No  Physical Activity: Sufficiently Active (02/22/2023)   Exercise Vital Sign    Days of Exercise per Week: 7 days    Minutes of Exercise per Session: 60 min  Stress: No Stress Concern Present (02/22/2023)   [employer] of Occupational Health - Occupational Stress Questionnaire    Feeling of Stress : Not at all  Social Connections: Moderately Isolated (02/22/2023)   Social Connection and Isolation Panel [NHANES]    Frequency of Communication with Friends and Family: Never    Frequency of Social Gatherings with Friends and Family: Once a week    Attends Religious Services: More than 4 times per year    Active Member of [employer] or Organizations: No    Attends Engineer, structural: Never    Marital Status: Married    Tobacco Counseling Counseling given: Not Answered   Clinical Intake:  Pre-visit preparation completed: Yes  Pain : No/denies pain     Nutritional Status: BMI 25 -29 Overweight Nutritional Risks: None Diabetes:  Yes CBG done?: No Did pt. bring in CBG monitor from home?: No  How often do you need to have someone help you when you read instructions, pamphlets, or other written materials from your doctor or pharmacy?: 4 - Often  Interpreter Needed?: Yes Interpreter Agency: [facility] Interpreter Name: Julia Porter  Information entered by :: NAllen LPN   Activities of Daily Living    02/22/2023   11:13 AM  In your present state of health, do you have any difficulty performing the following activities:  Hearing? 0  Vision? 0  Difficulty concentrating or making decisions? 0  Walking or climbing stairs? 0  Dressing or bathing? 0  Doing errands, shopping? 0  Preparing Food and eating ? N  Using the Toilet? N  In the past six months, have you accidently leaked urine? Y  Do you have problems with loss of bowel control? N  Managing your Medications? N  Managing your Finances? N  Housekeeping or managing your Housekeeping? N    Patient Care Team: Gerre Scull, NP as PCP - General (Internal Medicine)  Indicate any recent Medical Services you may have received from other than [facility] providers in the past year (date may be approximate).     Assessment:   This is a routine wellness examination for Julia Porter.  Hearing/Vision screen Hearing Screening - Comments:: Denies hearing issues Vision Screening - Comments:: No regular eye exams   Goals Addressed             This Visit's Progress    Patient Stated       02/22/2023, wants to get diabetes, BP and cholesterol under control       Depression Screen    02/22/2023   11:25 AM 02/14/2023    8:31 AM 08/06/2022    2:14 PM 11/13/2021   10:16 AM  PHQ 2/9 Scores  PHQ - 2 Score 0 0 0 0  PHQ- 9 Score   0     Fall Risk    02/22/2023   11:22 AM 02/14/2023    8:31 AM 08/06/2022    2:15 PM  Fall Risk   Falls in the past year? 0 0 0  Number falls in past yr: 0 0 0  Injury with Fall? 0 0 0  Risk for fall due to : Medication side effect No Fall Risks  No Fall Risks  Follow up Falls prevention discussed;Falls evaluation completed Falls evaluation completed Falls evaluation completed    MEDICARE RISK AT HOME: Medicare Risk at Home Any stairs in or around the home?: Yes If so, are there any without handrails?: No Home free of loose throw rugs in walkways, pet beds, electrical cords, etc?: Yes Adequate lighting in your home to reduce risk of falls?: Yes Life alert?: No Use of a cane, walker or w/c?: No Grab bars in  the bathroom?: Yes Shower chair or bench in shower?: No Elevated toilet seat or a handicapped toilet?: No  TIMED UP AND GO:  Was the test performed? Yes  Length of time to ambulate 10 feet: 5 sec Gait steady and fast without use of assistive device    Cognitive Function:  6 CIT not administered due to language barrier. Appeared cognitive per direct observation and via conversation with interpreter.        Immunizations Immunization History  Administered Date(s) Administered   Fluad Quad(high Dose 65+) 01/30/2022   Fluad Trivalent(High Dose 65+) 02/14/2023   Influenza Split 02/05/2015   PNEUMOCOCCAL CONJUGATE-20 01/30/2022   Td 11/14/2019    TDAP status: Up to date  Flu Vaccine status: Up to date  Pneumococcal vaccine status: Up to date  Covid-19 vaccine status: Information provided on how to obtain vaccines.   Qualifies for Shingles Vaccine? Yes   Zostavax completed No   Shingrix Completed?: No.    Education has been provided regarding the importance of this vaccine. Patient has been advised to call insurance company to determine out of pocket expense if they have not yet received this vaccine. Advised may also receive vaccine at local pharmacy or Health Dept. Verbalized acceptance and understanding.  Screening Tests Health Maintenance  Topic Date Due   Colonoscopy  Never done   Zoster Vaccines- Shingrix (1 of 2) Never done   DEXA SCAN  Never done   COVID-19 Vaccine (1 - 2023-24 season) 03/02/2023  (Originally 12/30/2022)   HEMOGLOBIN A1C  08/15/2023   Diabetic kidney evaluation - eGFR measurement  11/19/2023   Diabetic kidney evaluation - Urine ACR  11/19/2023   FOOT EXAM  02/14/2024   OPHTHALMOLOGY EXAM  02/14/2024   Medicare Annual Wellness (AWV)  02/22/2024   MAMMOGRAM  09/16/2024   DTaP/Tdap/Td (2 - Tdap) 11/13/2029   Pneumonia Vaccine 57+ Years old  Completed   INFLUENZA VACCINE  Completed   Hepatitis C Screening  Completed   HPV VACCINES  Aged Out    Health Maintenance  Health Maintenance Due  Topic Date Due   Colonoscopy  Never done   Zoster Vaccines- Shingrix (1 of 2) Never done   DEXA SCAN  Never done    Colorectal cancer screening: Type of screening: Cologuard. Completed 11/22/2022. Repeat every 3 years  Mammogram status: Completed 09/17/2022. Repeat every year  Bone Density status: Ordered 11/19/2022. Pt provided with contact info and advised to call to schedule appt.  Lung Cancer Screening: (Low Dose CT Chest recommended if Age 33-80 years, 20 pack-year currently smoking OR have quit w/in 15years.) does not qualify.   Lung Cancer Screening Referral: no  Additional Screening:  Hepatitis C Screening: does qualify; Completed 11/13/2021  Vision Screening: Recommended annual ophthalmology exams for early detection of glaucoma and other disorders of the eye. Is the patient up to date with their annual eye exam?  No  Who is the provider or what is the name of the office in which the patient attends annual eye exams? none If pt is not established with a provider, would they like to be referred to a provider to establish care? No .   Dental Screening: Recommended annual dental exams for proper oral hygiene  Diabetic Foot Exam: Diabetic Foot Exam: Completed 02/14/2023  Community Resource Referral / Chronic Care Management: CRR required this visit?  No   CCM required this visit?  No     Plan:     I have personally reviewed and noted the  following in the  patient's chart:   Medical and social history Use of alcohol, tobacco or illicit drugs  Current medications and supplements including opioid prescriptions. Patient is not currently taking opioid prescriptions. Functional ability and status Nutritional status Physical activity Advanced directives List of other physicians Hospitalizations, surgeries, and ER visits in previous 12 months Vitals Screenings to include cognitive, depression, and falls Referrals and appointments  In addition, I have reviewed and discussed with patient certain preventive protocols, quality metrics, and best practice recommendations. A written personalized care plan for preventive services as well as general preventive health recommendations were provided to patient.     Barb Merino, LPN   13/11/6576   After Visit Summary: (In Person-Printed) AVS printed and given to the patient  Nurse Notes: none

## 2023-04-01 ENCOUNTER — Ambulatory Visit: Payer: Medicare HMO | Admitting: Internal Medicine

## 2023-04-01 ENCOUNTER — Ambulatory Visit (INDEPENDENT_AMBULATORY_CARE_PROVIDER_SITE_OTHER): Payer: Medicare HMO | Admitting: Internal Medicine

## 2023-04-01 ENCOUNTER — Encounter: Payer: Self-pay | Admitting: Internal Medicine

## 2023-04-01 VITALS — BP 138/86 | HR 99 | Temp 98.7°F | Ht 61.5 in | Wt 137.6 lb

## 2023-04-01 DIAGNOSIS — M7542 Impingement syndrome of left shoulder: Secondary | ICD-10-CM

## 2023-04-01 MED ORDER — MELOXICAM 7.5 MG PO TABS
7.5000 mg | ORAL_TABLET | Freq: Every day | ORAL | 0 refills | Status: DC
Start: 2023-04-01 — End: 2023-08-21

## 2023-04-01 NOTE — Patient Instructions (Signed)
Take Meloxicam as prescribed  Use Lidocaine patches if needed  Alternate Ice and heat  Rest, no heavy lifting

## 2023-04-01 NOTE — Progress Notes (Signed)
[facility] [address] [place] [address] Dept: [phone] Dept Fax: [phone]  Acute Care Office Visit  Subjective:   Wille Wah Sep 29, 1950 04/01/2023  Chief Complaint  Patient presents with   Shoulder Pain    L shoulder pain onset 03/25/2023 Numb fingers  No SOB or chest pain  Pt states she has been taking tylenol for pain    Due to language barrier, a medical interpreter was present during the HPI, ROS, and discussion for the plan of care.  Interpreter: Pincus Large   HPI: Discussed the use of AI scribe software for clinical note transcription with the patient, who gave verbal consent to proceed.  History of Present Illness   The patient, with a history of diabetes, hypertension, and hypercholesterolemia, presents with a week-long history of severe, constant left shoulder and arm pain. The pain, described as sharp and crawling, radiates from the shoulder down to the arm, causing numbness in the left hand. The pain is not exacerbated by movement and is unresponsive to Tylenol. Pain worsens with laying on affected side. The patient denies any recent trauma, heavy lifting, chest pain, or cardiac symptoms. The patient has been using a lidocaine patch for pain management, which provides some relief.       The following portions of the patient's history were reviewed and updated as appropriate: past medical history, past surgical history, family history, social history, allergies, medications, and problem list.   Patient Active Problem List   Diagnosis Date Noted   Long term current use of oral hypoglycemic drug 11/19/2022   Stage 3b chronic kidney disease (HCC) 11/19/2022   Mixed hyperlipidemia 07/26/2022   Routine general medical examination at a health care facility 07/26/2022   Hypertension associated with diabetes (HCC) 11/13/2021   Hyponatremia 12/21/2018   Pneumonia due to COVID-19 virus 12/21/2018   Anemia  12/21/2018   Leukocytosis 12/21/2018   DM type 2 (diabetes mellitus, type 2) (HCC) 12/21/2018   Past Medical History:  Diagnosis Date   Diabetes mellitus without complication (HCC)    Hypertension    History reviewed. No pertinent surgical history. History reviewed. No pertinent family history.  Current Outpatient Medications:    ACCU-CHEK GUIDE test strip, 4 (four) times daily. for testing, Disp: , Rfl:    diclofenac Sodium (VOLTAREN ARTHRITIS PAIN) 1 % GEL, Apply 2 g topically 4 (four) times daily., Disp: 100 g, Rfl: 0   empagliflozin (JARDIANCE) 10 MG TABS tablet, Take 1 tablet (10 mg total) by mouth daily before breakfast., Disp: 90 tablet, Rfl: 1   glipiZIDE (GLUCOTROL XL) 10 MG 24 hr tablet, Take 1 tablet (10 mg total) by mouth daily with breakfast., Disp: 90 tablet, Rfl: 1   lisinopril-hydrochlorothiazide (ZESTORETIC) 10-12.5 MG tablet, Take 1 tablet by mouth daily. For blood pressure, Disp: 90 tablet, Rfl: 1   meloxicam (MOBIC) 7.5 MG tablet, Take 1 tablet (7.5 mg total) by mouth daily., Disp: 10 tablet, Rfl: 0   Misc. Devices (PULSE OXIMETER) MISC, 1 each by Does not apply route as needed., Disp: 1 each, Rfl: 0   pravastatin (PRAVACHOL) 40 MG tablet, Take 1 tablet (40 mg total) by mouth daily., Disp: 90 tablet, Rfl: 1 No Known Allergies   ROS: A complete ROS was performed with pertinent positives/negatives noted in the HPI. The remainder of the ROS are negative.    Objective:   Today's Vitals   04/01/23 1449  BP: 138/86  Pulse: 99  Temp: 98.7 F (37.1 C)  TempSrc: Temporal  SpO2: 98%  Weight: 137 lb 9.6 oz (62.4 kg)  Height: 5' 1.5" (1.562 m)  PainSc: 8   PainLoc: Shoulder    GENERAL: Well-appearing, in NAD. Well nourished.  SKIN: Pink, warm and dry. No rash, lesion, ulceration, or ecchymoses.  NECK: Trachea midline. Full ROM w/o pain or tenderness. No lymphadenopathy.  RESPIRATORY: Chest wall symmetrical. Respirations even and non-labored. Breath sounds clear to  auscultation bilaterally.  CARDIAC: S1, S2 present, regular rate and rhythm. Peripheral pulses 2+ bilaterally.  MUSCULOSKELETAL: Right shoulder pain upon arm abduction, external rotation. Hand grip strength 5/5 normal. No pain on elbow flexion or extension. TTP to left trapezius.  No spinal tenderness (+) hawkins sign  EXTREMITIES: Without clubbing, cyanosis, or edema.  NEUROLOGIC: No motor or sensory deficits. Steady, even gait.  PSYCH/MENTAL STATUS: Alert, oriented x 3. Cooperative, appropriate mood and affect.   EKG RESULT: EKG tracing is personally reviewed.   EKG: normal EKG, normal sinus rhythm.   No results found for any visits on 04/01/23.    Assessment & Plan:  Assessment and Plan    Shoulder Pain Severe, constant pain in the left shoulder and arm, described as sharp and crawling. No history of trauma or heavy lifting. Physical examination suggests possible shoulder impingement -Continue use of lidocaine patches and Tylenol as needed. -Prescribe short course of Meloxicam, to be taken with food. -Advise use of ice packs or heat packs. -If no improvement, consider referral to orthopedics for further imaging.      Meds ordered this encounter  Medications   meloxicam (MOBIC) 7.5 MG tablet    Sig: Take 1 tablet (7.5 mg total) by mouth daily.    Dispense:  10 tablet    Refill:  0    Order Specific Question:   Supervising Provider    Answer:   Garnette Gunner [[id number]]   Orders Placed This Encounter  Procedures   EKG 12-Lead   Lab Orders  No laboratory test(s) ordered today   No images are attached to the encounter or orders placed in the encounter.  Return if symptoms worsen or fail to improve.   Salvatore Decent, FNP

## 2023-04-03 ENCOUNTER — Ambulatory Visit: Payer: Medicare HMO | Admitting: Internal Medicine

## 2023-05-06 ENCOUNTER — Other Ambulatory Visit: Payer: Self-pay | Admitting: Nurse Practitioner

## 2023-05-07 NOTE — Telephone Encounter (Signed)
 Requesting: LISINOPRIL-HCTZ 10/12.5MG  TABLETS, GLIPIZIDE ER 10MG  TABLETS  Last Visit: 02/14/2023 Next Visit: 05/17/2023 Last Refill: 02/14/2023  Please Advise

## 2023-05-17 ENCOUNTER — Ambulatory Visit (INDEPENDENT_AMBULATORY_CARE_PROVIDER_SITE_OTHER): Payer: Medicare HMO | Admitting: Nurse Practitioner

## 2023-05-17 ENCOUNTER — Encounter: Payer: Self-pay | Admitting: Nurse Practitioner

## 2023-05-17 VITALS — BP 132/70 | HR 84 | Temp 98.0°F | Ht 61.5 in | Wt 143.6 lb

## 2023-05-17 DIAGNOSIS — M25512 Pain in left shoulder: Secondary | ICD-10-CM

## 2023-05-17 DIAGNOSIS — E782 Mixed hyperlipidemia: Secondary | ICD-10-CM

## 2023-05-17 DIAGNOSIS — G8929 Other chronic pain: Secondary | ICD-10-CM | POA: Insufficient documentation

## 2023-05-17 DIAGNOSIS — I152 Hypertension secondary to endocrine disorders: Secondary | ICD-10-CM

## 2023-05-17 DIAGNOSIS — N1832 Chronic kidney disease, stage 3b: Secondary | ICD-10-CM | POA: Diagnosis not present

## 2023-05-17 DIAGNOSIS — E1122 Type 2 diabetes mellitus with diabetic chronic kidney disease: Secondary | ICD-10-CM

## 2023-05-17 DIAGNOSIS — E1159 Type 2 diabetes mellitus with other circulatory complications: Secondary | ICD-10-CM

## 2023-05-17 DIAGNOSIS — Z7984 Long term (current) use of oral hypoglycemic drugs: Secondary | ICD-10-CM

## 2023-05-17 DIAGNOSIS — R051 Acute cough: Secondary | ICD-10-CM | POA: Diagnosis not present

## 2023-05-17 LAB — CBC WITH DIFFERENTIAL/PLATELET
Basophils Absolute: 0 10*3/uL (ref 0.0–0.1)
Basophils Relative: 0.4 % (ref 0.0–3.0)
Eosinophils Absolute: 0.2 10*3/uL (ref 0.0–0.7)
Eosinophils Relative: 1.8 % (ref 0.0–5.0)
HCT: 33.4 % — ABNORMAL LOW (ref 36.0–46.0)
Hemoglobin: 10.7 g/dL — ABNORMAL LOW (ref 12.0–15.0)
Lymphocytes Relative: 29.9 % (ref 12.0–46.0)
Lymphs Abs: 3.6 10*3/uL (ref 0.7–4.0)
MCHC: 31.9 g/dL (ref 30.0–36.0)
MCV: 78.1 fL (ref 78.0–100.0)
Monocytes Absolute: 0.7 10*3/uL (ref 0.1–1.0)
Monocytes Relative: 6.2 % (ref 3.0–12.0)
Neutro Abs: 7.4 10*3/uL (ref 1.4–7.7)
Neutrophils Relative %: 61.7 % (ref 43.0–77.0)
Platelets: 355 10*3/uL (ref 150.0–400.0)
RBC: 4.28 Mil/uL (ref 3.87–5.11)
RDW: 15.4 % (ref 11.5–15.5)
WBC: 12 10*3/uL — ABNORMAL HIGH (ref 4.0–10.5)

## 2023-05-17 LAB — COMPREHENSIVE METABOLIC PANEL
ALT: 18 U/L (ref 0–35)
AST: 19 U/L (ref 0–37)
Albumin: 4.6 g/dL (ref 3.5–5.2)
Alkaline Phosphatase: 89 U/L (ref 39–117)
BUN: 26 mg/dL — ABNORMAL HIGH (ref 6–23)
CO2: 27 meq/L (ref 19–32)
Calcium: 10 mg/dL (ref 8.4–10.5)
Chloride: 101 meq/L (ref 96–112)
Creatinine, Ser: 1.18 mg/dL (ref 0.40–1.20)
GFR: 46.22 mL/min — ABNORMAL LOW (ref >60.00)
Glucose, Bld: 152 mg/dL — ABNORMAL HIGH (ref 70–99)
Potassium: 4 meq/L (ref 3.5–5.1)
Sodium: 138 meq/L (ref 135–145)
Total Bilirubin: 0.3 mg/dL (ref 0.2–1.2)
Total Protein: 8.5 g/dL — ABNORMAL HIGH (ref 6.0–8.3)

## 2023-05-17 LAB — HEMOGLOBIN A1C: Hgb A1c MFr Bld: 7.6 % — ABNORMAL HIGH (ref 4.6–6.5)

## 2023-05-17 LAB — LIPID PANEL
Cholesterol: 179 mg/dL (ref 0–200)
HDL: 38.9 mg/dL — ABNORMAL LOW (ref >39.00)
LDL Cholesterol: 77 mg/dL (ref 0–99)
NonHDL: 139.62
Total CHOL/HDL Ratio: 5
Triglycerides: 314 mg/dL — ABNORMAL HIGH (ref 0.0–149.0)
VLDL: 62.8 mg/dL — ABNORMAL HIGH (ref 0.0–40.0)

## 2023-05-17 MED ORDER — GLIPIZIDE ER 10 MG PO TB24: 10.0000 mg | ORAL_TABLET | Freq: Every day | ORAL | 1 refills | Status: DC

## 2023-05-17 MED ORDER — BENZONATATE 100 MG PO CAPS: 100.0000 mg | ORAL_CAPSULE | Freq: Three times a day (TID) | ORAL | 0 refills | Status: DC | PRN

## 2023-05-17 MED ORDER — LISINOPRIL-HYDROCHLOROTHIAZIDE 10-12.5 MG PO TABS: 1.0000 | ORAL_TABLET | Freq: Every day | ORAL | 1 refills | Status: DC

## 2023-05-17 NOTE — Assessment & Plan Note (Addendum)
Chronic, stable. Continue taking lisinopril 10 mg daily and Jardiance 10 mg daily to help with kidney protection and blood sugars.  Check CMP, CBC today.  Follow-up in 3 months.

## 2023-05-17 NOTE — Patient Instructions (Signed)
It was great to see you!  Start stretches daily  Keep using the patches for pain  Let's follow-up in 3 months, sooner if you have concerns.  If a referral was placed today, you will be contacted for an appointment. Please note that routine referrals can sometimes take up to 3-4 weeks to process. Please call our office if you haven't heard anything after this time frame.  Take care,  Rodman Pickle, NP

## 2023-05-17 NOTE — Assessment & Plan Note (Signed)
Chronic, stable Continue lisinopril-hydrochlorothiazide 10-12.5mg  daily. Check CMP, CBC today.

## 2023-05-17 NOTE — Assessment & Plan Note (Signed)
Chronic, stable. Continue pravastatin 40mg  daily. Check CMP, CBC, lipid panel today. Follow-up in 6 months.

## 2023-05-17 NOTE — Assessment & Plan Note (Signed)
Continue glipizide XL 10 mg daily and Jardiance 10 mg daily.

## 2023-05-17 NOTE — Progress Notes (Addendum)
Established Patient Office Visit  Subjective   Patient ID: Julia Porter, female    DOB: 07/18/1950  Age: 73 y.o. MRN: [id number]  Chief Complaint  Patient presents with   Diabetes    Follow up, cough for 1 week, left shoulder pain for 1 month   Visit completed with in person interpreter  HPI  Discussed the use of AI scribe software for clinical note transcription with the patient, who gave verbal consent to proceed.  History of Present Illness   The patient, with a history of left arm numbness, presents with ongoing symptoms. The numbness, which was previously extending from the left shoulder to the elbow, has improved slightly since her last visit in December. Currently, the numbness/pain is localized to two spots, one on the neck and one on the elbow. The numbness is more pronounced in cold weather and does not seem to be affected by the patient's position or activity level. Over-the-counter lidocaine patches and prescribed meloxicam have provided some relief.  In addition to the numbness, the patient has been experiencing a dry cough for the past week. The cough is not associated with any other symptoms such as headaches, stuffy nose, sore throat, or shortness of breath. The patient has not been taking any medication for the cough but has been drinking plenty of water. The cough does not seem to be worse at night or during the day.  She has not been checking her sugars at home. She denies chest pain, shortness of breath, and neuropathy in her feet.        ROS See pertinent positives and negatives per HPI.    Objective:     BP 132/70 (BP Location: Left Arm, Cuff Size: Normal)   Pulse 84   Temp 98 F (36.7 C)   Ht 5' 1.5" (1.562 m)   Wt 143 lb 9.6 oz (65.1 kg)   SpO2 98%   BMI 26.69 kg/m    Physical Exam Vitals and nursing note reviewed.  Constitutional:      General: She is not in acute distress.    Appearance: Normal appearance.  HENT:     Head: Normocephalic.   Eyes:     Conjunctiva/sclera: Conjunctivae normal.  Cardiovascular:     Rate and Rhythm: Normal rate and regular rhythm.     Pulses: Normal pulses.     Heart sounds: Normal heart sounds.  Pulmonary:     Effort: Pulmonary effort is normal.     Breath sounds: Normal breath sounds.  Musculoskeletal:        General: No swelling or tenderness. Normal range of motion.     Cervical back: Normal range of motion.     Comments: Empty can test negative  Skin:    General: Skin is warm.  Neurological:     General: No focal deficit present.     Mental Status: She is alert and oriented to person, place, and time.  Psychiatric:        Mood and Affect: Mood normal.        Behavior: Behavior normal.        Thought Content: Thought content normal.        Judgment: Judgment normal.    The 10-year ASCVD risk score (Arnett DK, et al., 2019) is: 29.8%    Assessment & Plan:   Problem List Items Addressed This Visit       Cardiovascular and Mediastinum   Hypertension associated with diabetes (HCC) - Primary   Chronic, stable  Continue lisinopril-hydrochlorothiazide 10-12.5mg  daily. Check CMP, CBC today.       Relevant Medications   lisinopril-hydrochlorothiazide (ZESTORETIC) 10-12.5 MG tablet   glipiZIDE (GLUCOTROL XL) 10 MG 24 hr tablet   Other Relevant Orders   CBC with Differential/Platelet   Comprehensive metabolic panel     Endocrine   DM type 2 (diabetes mellitus, type 2) (HCC)   Chronic, stable. Continue glipizide XL 10 mg daily, Jardiance 10 mg daily. Check CMP, CBC, A1c today. Up to date on flu, pneumonia vaccine, eye and foot exam. Follow-up in 3 months.       Relevant Medications   lisinopril-hydrochlorothiazide (ZESTORETIC) 10-12.5 MG tablet   glipiZIDE (GLUCOTROL XL) 10 MG 24 hr tablet   Other Relevant Orders   CBC with Differential/Platelet   Comprehensive metabolic panel   Hemoglobin A1c     Genitourinary   Stage 3b chronic kidney disease (HCC)   Chronic, stable.  Continue taking lisinopril 10 mg daily and Jardiance 10 mg daily to help with kidney protection and blood sugars.  Check CMP, CBC today.  Follow-up in 3 months.      Relevant Orders   CBC with Differential/Platelet   Comprehensive metabolic panel     Other   Mixed hyperlipidemia   Chronic, stable. Continue pravastatin 40mg  daily. Check CMP, CBC, lipid panel today. Follow-up in 6 months.       Relevant Medications   lisinopril-hydrochlorothiazide (ZESTORETIC) 10-12.5 MG tablet   Other Relevant Orders   CBC with Differential/Platelet   Comprehensive metabolic panel   Lipid panel   Long term current use of oral hypoglycemic drug   Continue glipizide XL 10 mg daily and Jardiance 10 mg daily.      Chronic left shoulder pain   Pain and numbness in the left arm has improved with lidocaine patches and meloxicam. It is localized to the shoulder and elbow, with no neck pain or pain on movement or palpation. Continue using lidocaine patches and meloxicam as needed. Initiate recommended stretches.      Other Visit Diagnoses       Acute cough       Lungs clear. Start tessalon TID prn cough. Encourage fluids and hot tea. F/U if not improving       Return in about 3 months (around 08/15/2023) for Diabetes.    Gerre Scull, NP

## 2023-05-17 NOTE — Assessment & Plan Note (Signed)
Pain and numbness in the left arm has improved with lidocaine patches and meloxicam. It is localized to the shoulder and elbow, with no neck pain or pain on movement or palpation. Continue using lidocaine patches and meloxicam as needed. Initiate recommended stretches.

## 2023-05-17 NOTE — Assessment & Plan Note (Signed)
Chronic, stable. Continue glipizide XL 10 mg daily, Jardiance 10 mg daily. Check CMP, CBC, A1c today. Up to date on flu, pneumonia vaccine, eye and foot exam. Follow-up in 3 months.

## 2023-07-09 ENCOUNTER — Ambulatory Visit (AMBULATORY_SURGERY_CENTER)

## 2023-07-09 ENCOUNTER — Other Ambulatory Visit: Payer: Self-pay

## 2023-07-09 VITALS — Ht 61.5 in | Wt 142.0 lb

## 2023-07-09 DIAGNOSIS — Z1211 Encounter for screening for malignant neoplasm of colon: Secondary | ICD-10-CM

## 2023-07-09 MED ORDER — NA SULFATE-K SULFATE-MG SULF 17.5-3.13-1.6 GM/177ML PO SOLN: 1.0000 | Freq: Once | ORAL | 0 refills | Status: AC

## 2023-07-09 NOTE — Progress Notes (Signed)
 Denies allergies to eggs or soy products. Denies complication of anesthesia or sedation. Denies use of weight loss medication. Denies use of O2.   Emmi instructions given for colonoscopy.   Pre-Visit was conducted with Y Hin interpreting for Bethania. The patients daughter was also present but she did not know very much english. Patient was given an opportunity to ask questions and she verbalizes understanding of instructions.

## 2023-07-23 ENCOUNTER — Encounter: Payer: Self-pay | Admitting: Gastroenterology

## 2023-07-23 ENCOUNTER — Ambulatory Visit (AMBULATORY_SURGERY_CENTER): Admitting: Gastroenterology

## 2023-07-23 VITALS — BP 142/69 | HR 80 | Temp 98.0°F | Resp 20 | Ht 61.0 in | Wt 142.0 lb

## 2023-07-23 DIAGNOSIS — K5730 Diverticulosis of large intestine without perforation or abscess without bleeding: Secondary | ICD-10-CM

## 2023-07-23 DIAGNOSIS — D123 Benign neoplasm of transverse colon: Secondary | ICD-10-CM | POA: Diagnosis not present

## 2023-07-23 DIAGNOSIS — K640 First degree hemorrhoids: Secondary | ICD-10-CM | POA: Diagnosis not present

## 2023-07-23 DIAGNOSIS — Z1211 Encounter for screening for malignant neoplasm of colon: Secondary | ICD-10-CM | POA: Diagnosis not present

## 2023-07-23 DIAGNOSIS — I10 Essential (primary) hypertension: Secondary | ICD-10-CM | POA: Diagnosis not present

## 2023-07-23 DIAGNOSIS — D125 Benign neoplasm of sigmoid colon: Secondary | ICD-10-CM | POA: Diagnosis not present

## 2023-07-23 DIAGNOSIS — E119 Type 2 diabetes mellitus without complications: Secondary | ICD-10-CM | POA: Diagnosis not present

## 2023-07-23 NOTE — Progress Notes (Signed)
 Report to PACU, RN, vss, BBS= Clear.

## 2023-07-23 NOTE — Progress Notes (Signed)
 Pt's states no medical or surgical changes since previsit or office visit.

## 2023-07-23 NOTE — Op Note (Signed)
 [facility] Patient Name: Julia Porter Procedure Date: 07/23/2023 11:07 AM MRN: [id number] Endoscopist: Corliss Parish , MD, [id number] Age: 73 Referring MD:  Date of Birth: November 26, 1950 Gender: Female Account #: [id number] Procedure:                Colonoscopy Indications:              Screening for colorectal malignant neoplasm, This                            is the patient's first colonoscopy Medicines:                Monitored Anesthesia Care Procedure:                Pre-Anesthesia Assessment:                           - Prior to the procedure, a History and Physical                            was performed, and patient medications and                            allergies were reviewed. The patient's tolerance of                            previous anesthesia was also reviewed. The risks                            and benefits of the procedure and the sedation                            options and risks were discussed with the patient.                            All questions were answered, and informed consent                            was obtained. Prior Anticoagulants: The patient has                            taken no anticoagulant or antiplatelet agents                            except for NSAID medication. ASA Grade Assessment:                            II - A patient with mild systemic disease. After                            reviewing the risks and benefits, the patient was                            deemed in satisfactory condition to undergo the  procedure.                           After obtaining informed consent, the colonoscope                            was passed under direct vision. Throughout the                            procedure, the patient's blood pressure, pulse, and                            oxygen saturations were monitored continuously. The                            Olympus CF-HQ190L ([id number]) Colonoscope was                             introduced through the anus and advanced to the 3                            cm into the ileum. The colonoscopy was performed                            without difficulty. The patient tolerated the                            procedure. The quality of the bowel preparation was                            good. The terminal ileum, ileocecal valve,                            appendiceal orifice, and rectum were photographed. Scope In: 11:16:05 AM Scope Out: 11:27:47 AM Scope Withdrawal Time: 0 hours 7 minutes 48 seconds  Total Procedure Duration: 0 hours 11 minutes 42 seconds  Findings:                 The digital rectal exam was normal. Pertinent                            negatives include no palpable rectal lesions.                           The terminal ileum and ileocecal valve appeared                            normal.                           Two sessile polyps were found in the sigmoid colon                            and hepatic flexure. The polyps were 2 to 4 mm in  size. These polyps were removed with a cold snare.                            Resection and retrieval were complete.                           Many medium-mouthed and small-mouthed diverticula                            were found in the entire colon.                           Normal mucosa was found in the entire colon                            otherwise.                           Non-bleeding non-thrombosed internal hemorrhoids                            were found during retroflexion, during perianal                            exam and during digital exam. The hemorrhoids were                            Grade I (internal hemorrhoids that do not prolapse). Complications:            No immediate complications. Estimated Blood Loss:     Estimated blood loss was minimal. Impression:               - The examined portion of the ileum was normal.                            - Two 2 to 4 mm polyps in the sigmoid colon and at                            the hepatic flexure, removed with a cold snare.                            Resected and retrieved.                           - Diverticulosis in the entire examined colon.                           - Normal mucosa in the entire examined colon                            otherwise.                           - Non-bleeding non-thrombosed internal hemorrhoids. Recommendation:           - The patient will be observed post-procedure,  until all discharge criteria are met.                           - Discharge patient to home.                           - Patient has a contact number available for                            emergencies. The signs and symptoms of potential                            delayed complications were discussed with the                            patient. Return to normal activities tomorrow.                            Written discharge instructions were provided to the                            patient.                           - High fiber diet.                           - Use FiberCon 1-2 tablets PO daily.                           - Continue present medications.                           - Await pathology results.                           - Repeat colonoscopy in 5-10 years for surveillance                            based on pathology results.                           - The findings and recommendations were discussed                            with the patient.                           - The findings and recommendations were discussed                            with the patient's family. Corliss Parish, MD 07/23/2023 11:32:19 AM

## 2023-07-23 NOTE — Progress Notes (Signed)
 GASTROENTEROLOGY PROCEDURE H&P NOTE   Primary Care Physician: Gerre Scull, NP  HPI: Julia Porter is a 73 y.o. female who presents for Colonoscopy for screening.  Past Medical History:  Diagnosis Date   Arthritis    Diabetes mellitus without complication (HCC)    Hyperlipidemia    Hypertension    No past surgical history on file. Current Outpatient Medications  Medication Sig Dispense Refill   ACCU-CHEK GUIDE test strip 4 (four) times daily. for testing     benzonatate (TESSALON) 100 MG capsule Take 1 capsule (100 mg total) by mouth 3 (three) times daily as needed for cough. (Patient not taking: Reported on 07/09/2023) 30 capsule 0   diclofenac Sodium (VOLTAREN ARTHRITIS PAIN) 1 % GEL Apply 2 g topically 4 (four) times daily. (Patient not taking: Reported on 07/09/2023) 100 g 0   glipiZIDE (GLUCOTROL XL) 10 MG 24 hr tablet Take 1 tablet (10 mg total) by mouth daily with breakfast. 90 tablet 1   lisinopril-hydrochlorothiazide (ZESTORETIC) 10-12.5 MG tablet Take 1 tablet by mouth daily. For blood pressure 90 tablet 1   meloxicam (MOBIC) 7.5 MG tablet Take 1 tablet (7.5 mg total) by mouth daily. (Patient not taking: Reported on 07/09/2023) 10 tablet 0   Misc. Devices (PULSE OXIMETER) MISC 1 each by Does not apply route as needed. 1 each 0   pravastatin (PRAVACHOL) 40 MG tablet Take 1 tablet (40 mg total) by mouth daily. 90 tablet 1   No current facility-administered medications for this visit.    Current Outpatient Medications:    ACCU-CHEK GUIDE test strip, 4 (four) times daily. for testing, Disp: , Rfl:    benzonatate (TESSALON) 100 MG capsule, Take 1 capsule (100 mg total) by mouth 3 (three) times daily as needed for cough. (Patient not taking: Reported on 07/09/2023), Disp: 30 capsule, Rfl: 0   diclofenac Sodium (VOLTAREN ARTHRITIS PAIN) 1 % GEL, Apply 2 g topically 4 (four) times daily. (Patient not taking: Reported on 07/09/2023), Disp: 100 g, Rfl: 0   glipiZIDE (GLUCOTROL XL) 10  MG 24 hr tablet, Take 1 tablet (10 mg total) by mouth daily with breakfast., Disp: 90 tablet, Rfl: 1   lisinopril-hydrochlorothiazide (ZESTORETIC) 10-12.5 MG tablet, Take 1 tablet by mouth daily. For blood pressure, Disp: 90 tablet, Rfl: 1   meloxicam (MOBIC) 7.5 MG tablet, Take 1 tablet (7.5 mg total) by mouth daily. (Patient not taking: Reported on 07/09/2023), Disp: 10 tablet, Rfl: 0   Misc. Devices (PULSE OXIMETER) MISC, 1 each by Does not apply route as needed., Disp: 1 each, Rfl: 0   pravastatin (PRAVACHOL) 40 MG tablet, Take 1 tablet (40 mg total) by mouth daily., Disp: 90 tablet, Rfl: 1 No Known Allergies Family History  Problem Relation Age of Onset   Colon cancer Neg Hx    Esophageal cancer Neg Hx    Rectal cancer Neg Hx    Stomach cancer Neg Hx    Social History   Socioeconomic History   Marital status: Married    Spouse name: Not on file   Number of children: Not on file   Years of education: Not on file   Highest education level: Not on file  Occupational History   Not on file  Tobacco Use   Smoking status: Never   Smokeless tobacco: Never  Vaping Use   Vaping status: Never Used  Substance and Sexual Activity   Alcohol use: Never   Drug use: Never   Sexual activity: Not on file  Other Topics Concern   Not on file  Social History Narrative   Not on file   Social Drivers of Health   Financial Resource Strain: Low Risk  (02/22/2023)   Overall Financial Resource Strain (CARDIA)    Difficulty of Paying Living Expenses: Not hard at all  Food Insecurity: No Food Insecurity (02/22/2023)   Hunger Vital Sign    Worried About Running Out of Food in the Last Year: Never true    Ran Out of Food in the Last Year: Never true  Transportation Needs: No Transportation Needs (02/22/2023)   PRAPARE - Administrator, Civil Service (Medical): No    Lack of Transportation (Non-Medical): No  Physical Activity: Sufficiently Active (02/22/2023)   Exercise Vital Sign     Days of Exercise per Week: 7 days    Minutes of Exercise per Session: 60 min  Stress: No Stress Concern Present (02/22/2023)   [employer] of Occupational Health - Occupational Stress Questionnaire    Feeling of Stress : Not at all  Social Connections: Moderately Isolated (02/22/2023)   Social Connection and Isolation Panel [NHANES]    Frequency of Communication with Friends and Family: Never    Frequency of Social Gatherings with Friends and Family: Once a week    Attends Religious Services: More than 4 times per year    Active Member of [employer] or Organizations: No    Attends Banker Meetings: Never    Marital Status: Married  Catering manager Violence: Not At Risk (02/22/2023)   Humiliation, Afraid, Rape, and Kick questionnaire    Fear of Current or Ex-Partner: No    Emotionally Abused: No    Physically Abused: No    Sexually Abused: No    Physical Exam: There were no vitals filed for this visit. There is no height or weight on file to calculate BMI. GEN: NAD EYE: Sclerae anicteric ENT: MMM CV: Non-tachycardic GI: Soft, NT/ND NEURO:  Alert & Oriented x 3  Lab Results: No results for input(s): "WBC", "HGB", "HCT", "PLT" in the last 72 hours. BMET No results for input(s): "NA", "K", "CL", "CO2", "GLUCOSE", "BUN", "CREATININE", "CALCIUM" in the last 72 hours. LFT No results for input(s): "PROT", "ALBUMIN", "AST", "ALT", "ALKPHOS", "BILITOT", "BILIDIR", "IBILI" in the last 72 hours. PT/INR No results for input(s): "LABPROT", "INR" in the last 72 hours.   Impression / Plan: This is a 73 y.o.female who presents for Colonoscopy for screening.  The risks and benefits of endoscopic evaluation/treatment were discussed with the patient and/or family; these include but are not limited to the risk of perforation, infection, bleeding, missed lesions, lack of diagnosis, severe illness requiring hospitalization, as well as anesthesia and sedation related illnesses.   The patient's history has been reviewed, patient examined, no change in status, and deemed stable for procedure.  The patient and/or family is agreeable to proceed.    Corliss Parish, MD [facility] Office # [phone]

## 2023-07-23 NOTE — Patient Instructions (Signed)
 Handouts provided on polyps, diverticulosis and hemorrhoids.  Recommend a high-fiber diet (see handout). Use FiberCon 1-2 tablets by mouth daily.  Continue present medications.  Await pathology results. Repeat colonoscopy in 5-10 years for surveillance based on pathology results.   YOU HAD AN ENDOSCOPIC PROCEDURE TODAY AT THE [facility]:   Refer to the procedure report that was given to you for any specific questions about what was found during the examination.  If the procedure report does not answer your questions, please call your gastroenterologist to clarify.  If you requested that your care partner not be given the details of your procedure findings, then the procedure report has been included in a sealed envelope for you to review at your convenience later.  YOU SHOULD EXPECT: Some feelings of bloating in the abdomen. Passage of more gas than usual.  Walking can help get rid of the air that was put into your GI tract during the procedure and reduce the bloating. If you had a lower endoscopy (such as a colonoscopy or flexible sigmoidoscopy) you may notice spotting of blood in your stool or on the toilet paper. If you underwent a bowel prep for your procedure, you may not have a normal bowel movement for a few days.  Please Note:  You might notice some irritation and congestion in your nose or some drainage.  This is from the oxygen used during your procedure.  There is no need for concern and it should clear up in a day or so.  SYMPTOMS TO REPORT IMMEDIATELY:  Following lower endoscopy (colonoscopy or flexible sigmoidoscopy):  Excessive amounts of blood in the stool  Significant tenderness or worsening of abdominal pains  Swelling of the abdomen that is new, acute  Fever of 100F or higher  For urgent or emergent issues, a gastroenterologist can be reached at any hour by calling [phone]. Do not use MyChart messaging for urgent concerns.    DIET:  We do recommend a  small meal at first, but then you may proceed to your regular diet.  Drink plenty of fluids but you should avoid alcoholic beverages for 24 hours.  ACTIVITY:  You should plan to take it easy for the rest of today and you should NOT DRIVE or use heavy machinery until tomorrow (because of the sedation medicines used during the test).    FOLLOW UP: Our staff will call the number listed on your records the next business day following your procedure.  We will call around 7:15- 8:00 am to check on you and address any questions or concerns that you may have regarding the information given to you following your procedure. If we do not reach you, we will leave a message.     If any biopsies were taken you will be contacted by phone or by letter within the next 1-3 weeks.  Please call us at [phone] if you have not heard about the biopsies in 3 weeks.    SIGNATURES/CONFIDENTIALITY: You and/or your care partner have signed paperwork which will be entered into your electronic medical record.  These signatures attest to the fact that that the information above on your After Visit Summary has been reviewed and is understood.  Full responsibility of the confidentiality of this discharge information lies with you and/or your care-partner.

## 2023-07-24 ENCOUNTER — Telehealth: Payer: Self-pay

## 2023-07-24 NOTE — Telephone Encounter (Signed)
 Left message on answering machine.

## 2023-07-25 LAB — SURGICAL PATHOLOGY

## 2023-07-26 ENCOUNTER — Encounter: Payer: Self-pay | Admitting: Gastroenterology

## 2023-08-05 ENCOUNTER — Other Ambulatory Visit: Payer: Self-pay | Admitting: Nurse Practitioner

## 2023-08-05 NOTE — Telephone Encounter (Signed)
 Copied from CRM [id number]. Topic: Clinical - Medication Refill >> Aug 05, 2023 11:08 AM Drema Balzarine wrote: Most Recent Primary Care Visit:  Provider: Rodman Pickle A  Department: [facility]  Visit Type: OFFICE VISIT  Date: 05/17/2023  Medication: glipiZIDE, lisinopril-hydrochlorothiazide  Has the patient contacted their pharmacy? Yes (Agent: If no, request that the patient contact the pharmacy for the refill. If patient does not wish to contact the pharmacy document the reason why and proceed with request.) (Agent: If yes, when and what did the pharmacy advise?)  Is this the correct pharmacy for this prescription? Yes If no, delete pharmacy and type the correct one.  This is the patient's preferred pharmacy:   [facility] #[id number] [place], [place] - [phone] AT [facility] OF [facility] & [address] [place] [address] Phone: [phone] Fax: [phone]   Has the prescription been filled recently? Yes  Is the patient out of the medication? Yes, yesterday last day   Has the patient been seen for an appointment in the last year OR does the patient have an upcoming appointment? Yes  Can we respond through MyChart? No  Agent: Please be advised that Rx refills may take up to 3 business days. We ask that you follow-up with your pharmacy.

## 2023-08-06 MED ORDER — LISINOPRIL-HYDROCHLOROTHIAZIDE 10-12.5 MG PO TABS: 1.0000 | ORAL_TABLET | Freq: Every day | ORAL | 0 refills | Status: DC

## 2023-08-06 MED ORDER — GLIPIZIDE ER 10 MG PO TB24: 10.0000 mg | ORAL_TABLET | Freq: Every day | ORAL | 0 refills | Status: DC

## 2023-08-06 NOTE — Telephone Encounter (Signed)
 Requesting: glipiZIDE (GLUCOTROL XL) 10 MG 24 hr tablet , lisinopril-hydrochlorothiazide (ZESTORETIC) 10-12.5 MG tablet  Last Visit: 05/17/2023 Next Visit: 08/21/2023 Last Refill: 05/17/2023  Please Advise

## 2023-08-06 NOTE — Telephone Encounter (Signed)
 Medication refilled. I called patient and no answerer and voicemail not set up. I will try to call again later.

## 2023-08-21 ENCOUNTER — Ambulatory Visit (INDEPENDENT_AMBULATORY_CARE_PROVIDER_SITE_OTHER): Payer: Medicare HMO | Admitting: Nurse Practitioner

## 2023-08-21 ENCOUNTER — Encounter: Payer: Self-pay | Admitting: Nurse Practitioner

## 2023-08-21 VITALS — BP 128/70 | HR 76 | Temp 97.3°F | Ht 61.0 in | Wt 141.2 lb

## 2023-08-21 DIAGNOSIS — Z7984 Long term (current) use of oral hypoglycemic drugs: Secondary | ICD-10-CM | POA: Diagnosis not present

## 2023-08-21 DIAGNOSIS — N1832 Chronic kidney disease, stage 3b: Secondary | ICD-10-CM | POA: Diagnosis not present

## 2023-08-21 DIAGNOSIS — E1122 Type 2 diabetes mellitus with diabetic chronic kidney disease: Secondary | ICD-10-CM

## 2023-08-21 DIAGNOSIS — M25511 Pain in right shoulder: Secondary | ICD-10-CM | POA: Diagnosis not present

## 2023-08-21 LAB — POCT GLYCOSYLATED HEMOGLOBIN (HGB A1C)
HbA1c POC (<> result, manual entry): 7.6 % (ref 4.0–5.6)
HbA1c, POC (controlled diabetic range): 7.6 % — AB (ref 0.0–7.0)
HbA1c, POC (prediabetic range): 7.6 % — AB (ref 5.7–6.4)
Hemoglobin A1C: 7.6 % — AB (ref 4.0–5.6)

## 2023-08-21 MED ORDER — MELOXICAM 7.5 MG PO TABS: 7.5000 mg | ORAL_TABLET | Freq: Every day | ORAL | 0 refills | Status: AC

## 2023-08-21 NOTE — Assessment & Plan Note (Signed)
 Continue glipizide XL 10 mg daily and Jardiance 10 mg daily.

## 2023-08-21 NOTE — Patient Instructions (Signed)
 It was great to see you!  Start meloxicam  1 tablet daily as needed for your shoulder pain  Let's follow-up in 3 months, sooner if you have concerns.  If a referral was placed today, you will be contacted for an appointment. Please note that routine referrals can sometimes take up to 3-4 weeks to process. Please call our office if you haven't heard anything after this time frame.  Take care,  Rheba Cedar, NP

## 2023-08-21 NOTE — Assessment & Plan Note (Signed)
 Chronic, stable. Continue glipizide  XL 10 mg daily, Jardiance  10 mg daily. A1c 7.6% today. Up to date on flu, pneumonia vaccine, eye and foot exam. Follow-up in 3 months.

## 2023-08-21 NOTE — Progress Notes (Signed)
 Established Patient Office Visit  Subjective   Patient ID: Julia Porter, female    DOB: 1950/10/29  Age: 73 y.o. MRN: [id number]  Chief Complaint  Patient presents with   Diabetes    Follow up, concerns with right shoulder pain   Visit completed with video interpreter  HPI:  Discussed the use of AI scribe software for clinical note transcription with the patient, who gave verbal consent to proceed.  History of Present Illness   The patient, with a history of right shoulder pain, reports a recurrence of pain over the past two weeks. The pain, initially resolved, returned after carrying heavy items. The discomfort is localized to the top of the right arm and does not radiate down the arm. The pain is intermittent, lasting for a while before resolving. She has been managing the pain with patches which she reports was effective in relieving the pain. She has since run out of the medication. Despite the pain, she reports no limitations in arm movement.  The patient also has a history of diabetes, which appears to be well-managed with medication. She does not check her blood sugars at home. She denies chest pain, shortness of breath, changes in vision, and numbness in her feet.        ROS See pertinent positives and negatives per HPI.    Objective:     BP 128/70 (BP Location: Left Arm, Patient Position: Sitting, Cuff Size: Small)   Pulse 76   Temp (!) 97.3 F (36.3 C)   Ht 5\' 1"  (1.549 m)   Wt 141 lb 3.2 oz (64 kg)   SpO2 98%   BMI 26.68 kg/m    Physical Exam Vitals and nursing note reviewed.  Constitutional:      General: She is not in acute distress.    Appearance: Normal appearance.  HENT:     Head: Normocephalic.  Eyes:     Conjunctiva/sclera: Conjunctivae normal.  Cardiovascular:     Rate and Rhythm: Normal rate and regular rhythm.     Pulses: Normal pulses.     Heart sounds: Normal heart sounds.  Pulmonary:     Effort: Pulmonary effort is normal.     Breath  sounds: Normal breath sounds.  Musculoskeletal:        General: No swelling, tenderness or deformity. Normal range of motion.     Cervical back: Normal range of motion.  Skin:    General: Skin is warm.  Neurological:     General: No focal deficit present.     Mental Status: She is alert and oriented to person, place, and time.  Psychiatric:        Mood and Affect: Mood normal.        Behavior: Behavior normal.        Thought Content: Thought content normal.        Judgment: Judgment normal.   The 10-year ASCVD risk score (Arnett DK, et al., 2019) is: 28.2%    Assessment & Plan:   Problem List Items Addressed This Visit       Endocrine   DM type 2 (diabetes mellitus, type 2) (HCC) - Primary   Chronic, stable. Continue glipizide  XL 10 mg daily, Jardiance  10 mg daily. A1c 7.6% today. Up to date on flu, pneumonia vaccine, eye and foot exam. Follow-up in 3 months.       Relevant Orders   POCT glycosylated hemoglobin (Hb A1C) (Completed)     Other   Long term current use  of oral hypoglycemic drug   Continue glipizide  XL 10 mg daily and Jardiance  10 mg daily.      Other Visit Diagnoses       Acute pain of right shoulder       Will have her start meloxicam  7.5mg  daily in addition to lidocaine patch. Stretches given to do daily. Follow-up if symptoms worsen.   Relevant Medications   meloxicam  (MOBIC ) 7.5 MG tablet        Return in about 3 months (around 11/20/2023) for Diabetes.    Odette Benjamin, NP

## 2023-09-20 ENCOUNTER — Telehealth: Payer: Self-pay | Admitting: Nurse Practitioner

## 2023-09-20 MED ORDER — PRAVASTATIN SODIUM 40 MG PO TABS: 40.0000 mg | ORAL_TABLET | Freq: Every day | ORAL | 1 refills | Status: DC

## 2023-09-20 NOTE — Telephone Encounter (Signed)
 Copied from CRM [id number]. Topic: Clinical - Medication Refill >> Sep 20, 2023 11:52 AM Adonis Hoot wrote: Medication: pravastatin  (PRAVACHOL ) 40 MG tablet  Has the patient contacted their pharmacy? No (Agent: If no, request that the patient contact the pharmacy for the refill. If patient does not wish to contact the pharmacy document the reason why and proceed with request.) (Agent: If yes, when and what did the pharmacy advise?)  This is the patient's preferred pharmacy:  [facility] #[id number] [place], [place] - [address] AT [facility] OF [facility] & [address] [place] [address] Phone: [phone] Fax: [phone]  Is this the correct pharmacy for this prescription? Yes If no, delete pharmacy and type the correct one.   Has the prescription been filled recently? No  Is the patient out of the medication? Yes  Has the patient been seen for an appointment in the last year OR does the patient have an upcoming appointment? Yes  Can we respond through MyChart? no  Agent: Please be advised that Rx refills may take up to 3 business days. We ask that you follow-up with your pharmacy.

## 2023-11-04 ENCOUNTER — Other Ambulatory Visit: Payer: Self-pay | Admitting: Nurse Practitioner

## 2023-11-04 NOTE — Telephone Encounter (Unsigned)
 Copied from CRM [id number]. Topic: Clinical - Medication Refill >> Nov 04, 2023 11:52 AM Ernestene P wrote: Medication: glipiZIDE  (GLUCOTROL  XL) 10 MG 24 hr tablet  Has the patient contacted their pharmacy? Yes (Agent: If no, request that the patient contact the pharmacy for the refill. If patient does not wish to contact the pharmacy document the reason why and proceed with request.) (Agent: If yes, when and what did the pharmacy advise?)  This is the patient's preferred pharmacy:  [facility] #[id number] [place], [place] - [address] AT [facility] OF [facility] & [address] [place] [address] Phone: [phone] Fax: [phone]  Is this the correct pharmacy for this prescription? Yes If no, delete pharmacy and type the correct one.   Has the prescription been filled recently? No  Is the patient out of the medication? Yes  Has the patient been seen for an appointment in the last year OR does the patient have an upcoming appointment? Yes  Can we respond through MyChart? Yes  Agent: Please be advised that Rx refills may take up to 3 business days. We ask that you follow-up with your pharmacy.

## 2023-11-06 MED ORDER — GLIPIZIDE ER 10 MG PO TB24: 10.0000 mg | ORAL_TABLET | Freq: Every day | ORAL | 0 refills | Status: DC

## 2023-11-06 NOTE — Telephone Encounter (Signed)
 Requesting: glipiZIDE  (GLUCOTROL  XL) 10 MG 24 hr tablet  Last Visit: 08/21/2023 Next Visit: 11/26/2023 Last Refill: 08/06/2023  Please Advise

## 2023-11-06 NOTE — Telephone Encounter (Signed)
 I tried to call patient to notify of Rx approval and no voicemail. Will try again later.

## 2023-11-26 ENCOUNTER — Encounter: Payer: Self-pay | Admitting: Nurse Practitioner

## 2023-11-26 ENCOUNTER — Telehealth: Payer: Self-pay

## 2023-11-26 ENCOUNTER — Other Ambulatory Visit

## 2023-11-26 ENCOUNTER — Ambulatory Visit (INDEPENDENT_AMBULATORY_CARE_PROVIDER_SITE_OTHER): Admitting: Nurse Practitioner

## 2023-11-26 VITALS — BP 126/72 | HR 73 | Temp 96.8°F | Ht 61.0 in | Wt 142.8 lb

## 2023-11-26 DIAGNOSIS — Z1231 Encounter for screening mammogram for malignant neoplasm of breast: Secondary | ICD-10-CM

## 2023-11-26 DIAGNOSIS — E1122 Type 2 diabetes mellitus with diabetic chronic kidney disease: Secondary | ICD-10-CM | POA: Diagnosis not present

## 2023-11-26 DIAGNOSIS — I152 Hypertension secondary to endocrine disorders: Secondary | ICD-10-CM

## 2023-11-26 DIAGNOSIS — R051 Acute cough: Secondary | ICD-10-CM | POA: Diagnosis not present

## 2023-11-26 DIAGNOSIS — N1832 Chronic kidney disease, stage 3b: Secondary | ICD-10-CM

## 2023-11-26 DIAGNOSIS — E1159 Type 2 diabetes mellitus with other circulatory complications: Secondary | ICD-10-CM

## 2023-11-26 DIAGNOSIS — Z7984 Long term (current) use of oral hypoglycemic drugs: Secondary | ICD-10-CM

## 2023-11-26 LAB — CBC WITH DIFFERENTIAL/PLATELET
Basophils Absolute: 0.1 K/uL (ref 0.0–0.1)
Basophils Relative: 0.6 % (ref 0.0–3.0)
Eosinophils Absolute: 0.1 K/uL (ref 0.0–0.7)
Eosinophils Relative: 1.5 % (ref 0.0–5.0)
HCT: 34.6 % — ABNORMAL LOW (ref 36.0–46.0)
Hemoglobin: 11 g/dL — ABNORMAL LOW (ref 12.0–15.0)
Lymphocytes Relative: 34.6 % (ref 12.0–46.0)
Lymphs Abs: 3.2 K/uL (ref 0.7–4.0)
MCHC: 31.9 g/dL (ref 30.0–36.0)
MCV: 78.4 fl (ref 78.0–100.0)
Monocytes Absolute: 0.5 K/uL (ref 0.1–1.0)
Monocytes Relative: 5.1 % (ref 3.0–12.0)
Neutro Abs: 5.3 K/uL (ref 1.4–7.7)
Neutrophils Relative %: 58.2 % (ref 43.0–77.0)
Platelets: 304 K/uL (ref 150.0–400.0)
RBC: 4.42 Mil/uL (ref 3.87–5.11)
RDW: 14.2 % (ref 11.5–15.5)
WBC: 9.1 K/uL (ref 4.0–10.5)

## 2023-11-26 LAB — COMPREHENSIVE METABOLIC PANEL WITH GFR
ALT: 22 U/L (ref 0–35)
AST: 25 U/L (ref 0–37)
Albumin: 4.6 g/dL (ref 3.5–5.2)
Alkaline Phosphatase: 71 U/L (ref 39–117)
BUN: 34 mg/dL — ABNORMAL HIGH (ref 6–23)
CO2: 24 meq/L (ref 19–32)
Calcium: 9.9 mg/dL (ref 8.4–10.5)
Chloride: 103 meq/L (ref 96–112)
Creatinine, Ser: 1.4 mg/dL — ABNORMAL HIGH (ref 0.40–1.20)
GFR: 37.51 mL/min — ABNORMAL LOW (ref >60.00)
Glucose, Bld: 181 mg/dL — ABNORMAL HIGH (ref 70–99)
Potassium: 4.9 meq/L (ref 3.5–5.1)
Sodium: 137 meq/L (ref 135–145)
Total Bilirubin: 0.3 mg/dL (ref 0.2–1.2)
Total Protein: 8.5 g/dL — ABNORMAL HIGH (ref 6.0–8.3)

## 2023-11-26 LAB — MICROALBUMIN / CREATININE URINE RATIO
Creatinine,U: 29.8 mg/dL
Microalb Creat Ratio: 34.9 mg/g — ABNORMAL HIGH (ref 0.0–30.0)
Microalb, Ur: 1 mg/dL (ref 0.0–1.9)

## 2023-11-26 LAB — LIPID PANEL
Cholesterol: 177 mg/dL (ref 0–200)
HDL: 41.1 mg/dL (ref >39.00)
LDL Cholesterol: 86 mg/dL (ref 0–99)
NonHDL: 136.26
Total CHOL/HDL Ratio: 4
Triglycerides: 253 mg/dL — ABNORMAL HIGH (ref 0.0–149.0)
VLDL: 50.6 mg/dL — ABNORMAL HIGH (ref 0.0–40.0)

## 2023-11-26 MED ORDER — BENZONATATE 100 MG PO CAPS: 100.0000 mg | ORAL_CAPSULE | Freq: Three times a day (TID) | ORAL | 0 refills | Status: DC | PRN

## 2023-11-26 MED ORDER — LISINOPRIL-HYDROCHLOROTHIAZIDE 10-12.5 MG PO TABS: 1.0000 | ORAL_TABLET | Freq: Every day | ORAL | 0 refills | Status: DC

## 2023-11-26 NOTE — Telephone Encounter (Signed)
 Darice called from [facility] and said for some reason which is just Asian patient's that their HgB AlC needs to be sent out to [facility] due to something different in their blood.  She wanted you to put a notification in their chart for this. Darice canceled order for [facility] and will send to [facility].

## 2023-11-26 NOTE — Assessment & Plan Note (Signed)
 Chronic, stable Continue lisinopril-hydrochlorothiazide 10-12.5mg  daily. Check CMP, CBC today.

## 2023-11-26 NOTE — Patient Instructions (Signed)
It was great to see you!  We are checking your labs today and will let you know the results via mychart/phone.   Let's follow-up in 3 months, sooner if you have concerns.  If a referral was placed today, you will be contacted for an appointment. Please note that routine referrals can sometimes take up to 3-4 weeks to process. Please call our office if you haven't heard anything after this time frame.  Take care,  Nanako Stopher, NP  

## 2023-11-26 NOTE — Assessment & Plan Note (Addendum)
 Chronic, stable. Her diabetes is well-controlled with recent glucose readings of 110 and 128 mg/dL. No neuropathy symptoms are present. Continue glipizide  XL 10mg  daily and Jardiance  10mg  daily. Check CMP, CBC, A1c, urine microalbumin, and lipid panel today. Follow-up in 3 months.

## 2023-11-26 NOTE — Progress Notes (Addendum)
 Established Patient Office Visit  Subjective   Patient ID: Julia Porter, female    DOB: 1951/04/30  Age: 73 y.o. MRN: [id number]  Chief Complaint  Patient presents with   Hypertension associated with diabetes    Follow up, concerns with coughing for 1 week   Visit completed with in person interpreter  HPI  Discussed the use of AI scribe software for clinical note transcription with the patient, who gave verbal consent to proceed.  History of Present Illness   Julia Porter is a 73 year old female who presents with a dry cough and to follow-up on diabetes.  She experiences a dry cough without associated symptoms such as nasal congestion, rhinorrhea, pharyngitis, chest pain, or dyspnea. She has not tried anything over the counter for her symptoms. No sick contacts.     She denies numbness or tingling in her feet. Her recent blood glucose levels are 128 mg/dL and 889 mg/dL. She denies chest pain and shortness of breath.        ROS See pertinent positives and negatives per HPI.    Objective:     BP 126/72 (BP Location: Left Arm, Patient Position: Sitting, Cuff Size: Normal)   Pulse 73   Temp (!) 96.8 F (36 C)   Ht 5' 1 (1.549 m)   Wt 142 lb 12.8 oz (64.8 kg)   SpO2 100%   BMI 26.98 kg/m    Physical Exam Vitals and nursing note reviewed.  Constitutional:      General: She is not in acute distress.    Appearance: Normal appearance.  HENT:     Head: Normocephalic.     Right Ear: Tympanic membrane, ear canal and external ear normal.     Left Ear: Tympanic membrane, ear canal and external ear normal.     Nose: Nose normal. No rhinorrhea.     Mouth/Throat:     Mouth: Mucous membranes are moist.     Pharynx: No oropharyngeal exudate or posterior oropharyngeal erythema.  Eyes:     Conjunctiva/sclera: Conjunctivae normal.  Cardiovascular:     Rate and Rhythm: Normal rate and regular rhythm.     Pulses: Normal pulses.     Heart sounds: Normal heart sounds.  Pulmonary:      Effort: Pulmonary effort is normal.     Breath sounds: Normal breath sounds.  Musculoskeletal:     Cervical back: Normal range of motion and neck supple. No tenderness.  Lymphadenopathy:     Cervical: No cervical adenopathy.  Skin:    General: Skin is warm.  Neurological:     General: No focal deficit present.     Mental Status: She is alert and oriented to person, place, and time.  Psychiatric:        Mood and Affect: Mood normal.        Behavior: Behavior normal.        Thought Content: Thought content normal.        Judgment: Judgment normal.    The 10-year ASCVD risk score (Arnett DK, et al., 2019) is: 27.4%    Assessment & Plan:   Problem List Items Addressed This Visit       Cardiovascular and Mediastinum   Hypertension associated with diabetes (HCC)   Chronic, stable Continue lisinopril -hydrochlorothiazide  10-12.5mg  daily. Check CMP, CBC today.       Relevant Orders   CBC with Differential/Platelet   Comprehensive metabolic panel with GFR     Endocrine   DM type 2 (diabetes  mellitus, type 2) (HCC)   Chronic, stable. Her diabetes is well-controlled with recent glucose readings of 110 and 128 mg/dL. No neuropathy symptoms are present. Continue glipizide  XL 10mg  daily and Jardiance  10mg  daily. Check CMP, CBC, A1c, urine microalbumin, and lipid panel today. Follow-up in 3 months.       Relevant Orders   CBC with Differential/Platelet   Comprehensive metabolic panel with GFR   Lipid panel   Hemoglobin A1c   Microalbumin / creatinine urine ratio     Genitourinary   Stage 3b chronic kidney disease (HCC)   Chronic, stable. Continue taking lisinopril  10 mg daily and Jardiance  10 mg daily to help with kidney protection and blood sugars.  Check CMP, CBC today.  Follow-up in 3 months.      Relevant Orders   CBC with Differential/Platelet   Comprehensive metabolic panel with GFR     Other   Long term current use of oral hypoglycemic drug   Continue glipizide  XL 10  mg daily and Jardiance  10 mg daily.      Other Visit Diagnoses       Acute cough    -  Primary   She has a dry cough without other symptoms for 1 week. Start tessalon  100mg  TID prn cough.     Encounter for screening mammogram for malignant neoplasm of breast       Mammogram ordered today   Relevant Orders   MM 3D SCREENING MAMMOGRAM BILATERAL BREAST       Return in about 3 months (around 02/26/2024) for CPE.    Tinnie DELENA Harada, NP

## 2023-11-26 NOTE — Addendum Note (Signed)
 Addended by: Meria Crilly A on: 11/26/2023 10:14 AM   Modules accepted: Orders

## 2023-11-26 NOTE — Addendum Note (Signed)
 Addended by: Ixchel Duck A on: 11/26/2023 11:01 AM   Modules accepted: Level of Service

## 2023-11-26 NOTE — Assessment & Plan Note (Signed)
 Continue glipizide XL 10 mg daily and Jardiance 10 mg daily.

## 2023-11-26 NOTE — Assessment & Plan Note (Signed)
 Chronic, stable. Continue taking lisinopril 10 mg daily and Jardiance 10 mg daily to help with kidney protection and blood sugars.  Check CMP, CBC today.  Follow-up in 3 months.

## 2023-11-27 ENCOUNTER — Ambulatory Visit: Payer: Self-pay | Admitting: Nurse Practitioner

## 2023-11-27 LAB — HEMOGLOBIN A1C
Est. average glucose Bld gHb Est-mCnc: 180 mg/dL
Hgb A1c MFr Bld: 7.9 % — ABNORMAL HIGH (ref 4.8–5.6)

## 2023-11-27 MED ORDER — FENOFIBRATE 48 MG PO TABS: 48.0000 mg | ORAL_TABLET | Freq: Every day | ORAL | 0 refills | Status: DC

## 2023-11-27 NOTE — Telephone Encounter (Signed)
 Noted

## 2023-12-04 MED ORDER — RYBELSUS 3 MG PO TABS: 3.0000 mg | ORAL_TABLET | Freq: Every day | ORAL | 0 refills | Status: DC

## 2024-01-31 ENCOUNTER — Other Ambulatory Visit: Payer: Self-pay | Admitting: Nurse Practitioner

## 2024-01-31 DIAGNOSIS — N1832 Chronic kidney disease, stage 3b: Secondary | ICD-10-CM

## 2024-01-31 MED ORDER — GLIPIZIDE ER 10 MG PO TB24: 10.0000 mg | ORAL_TABLET | Freq: Every day | ORAL | 0 refills | Status: DC

## 2024-01-31 NOTE — Telephone Encounter (Signed)
 Copied from CRM [id number]. Topic: Clinical - Medication Refill >> Jan 31, 2024 10:47 AM Carlyon D wrote: Medication: glipiZIDE  (GLUCOTROL  XL) 10 MG 24 hr tablet  Has the patient contacted their pharmacy? No (Agent: If no, request that the patient contact the pharmacy for the refill. If patient does not wish to contact the pharmacy document the reason why and proceed with request.) (Agent: If yes, when and what did the pharmacy advise?)  This is the patient's preferred pharmacy:  [facility] #[id number] [address], [place] - [address] AT [facility] OF [facility] & [address] [place] [address] Phone: [phone] Fax: [phone]  Is this the correct pharmacy for this prescription? Yes If no, delete pharmacy and type the correct one.   Has the prescription been filled recently? No  Is the patient out of the medication? Yes  Has the patient been seen for an appointment in the last year OR does the patient have an upcoming appointment? Yes  Can we respond through MyChart? No, ph one call   Agent: Please be advised that Rx refills may take up to 3 business days. We ask that you follow-up with your pharmacy.

## 2024-01-31 NOTE — Telephone Encounter (Signed)
 Requesting: glipiZIDE  (GLUCOTROL  XL) 10 MG 24 hr tablet  Last Visit: 11/26/2023 Next Visit: 02/17/2024 Last Refill: 11/06/2023  Please Advise   Patient notified that Rx approved.

## 2024-02-17 ENCOUNTER — Encounter: Payer: Self-pay | Admitting: Nurse Practitioner

## 2024-02-17 ENCOUNTER — Ambulatory Visit: Payer: Self-pay | Admitting: Nurse Practitioner

## 2024-02-17 ENCOUNTER — Ambulatory Visit
Admission: RE | Admit: 2024-02-17 | Discharge: 2024-02-17 | Disposition: A | Source: Ambulatory Visit | Attending: Nurse Practitioner | Admitting: Nurse Practitioner

## 2024-02-17 ENCOUNTER — Ambulatory Visit: Admitting: Nurse Practitioner

## 2024-02-17 VITALS — BP 142/82 | HR 72 | Temp 97.1°F | Ht 61.0 in | Wt 141.0 lb

## 2024-02-17 DIAGNOSIS — Z0000 Encounter for general adult medical examination without abnormal findings: Secondary | ICD-10-CM | POA: Diagnosis not present

## 2024-02-17 DIAGNOSIS — N1832 Chronic kidney disease, stage 3b: Secondary | ICD-10-CM

## 2024-02-17 DIAGNOSIS — E1159 Type 2 diabetes mellitus with other circulatory complications: Secondary | ICD-10-CM

## 2024-02-17 DIAGNOSIS — Z23 Encounter for immunization: Secondary | ICD-10-CM

## 2024-02-17 DIAGNOSIS — I152 Hypertension secondary to endocrine disorders: Secondary | ICD-10-CM

## 2024-02-17 DIAGNOSIS — Z7984 Long term (current) use of oral hypoglycemic drugs: Secondary | ICD-10-CM | POA: Diagnosis not present

## 2024-02-17 DIAGNOSIS — I499 Cardiac arrhythmia, unspecified: Secondary | ICD-10-CM

## 2024-02-17 DIAGNOSIS — E782 Mixed hyperlipidemia: Secondary | ICD-10-CM | POA: Diagnosis not present

## 2024-02-17 DIAGNOSIS — E1122 Type 2 diabetes mellitus with diabetic chronic kidney disease: Secondary | ICD-10-CM

## 2024-02-17 DIAGNOSIS — Z1231 Encounter for screening mammogram for malignant neoplasm of breast: Secondary | ICD-10-CM

## 2024-02-17 LAB — LIPID PANEL
Cholesterol: 168 mg/dL (ref 0–200)
HDL: 38.8 mg/dL — ABNORMAL LOW (ref >39.00)
LDL Cholesterol: 87 mg/dL (ref 0–99)
NonHDL: 129.06
Total CHOL/HDL Ratio: 4
Triglycerides: 211 mg/dL — ABNORMAL HIGH (ref 0.0–149.0)
VLDL: 42.2 mg/dL — ABNORMAL HIGH (ref 0.0–40.0)

## 2024-02-17 LAB — BASIC METABOLIC PANEL WITH GFR
BUN: 28 mg/dL — ABNORMAL HIGH (ref 6–23)
CO2: 27 meq/L (ref 19–32)
Calcium: 10.3 mg/dL (ref 8.4–10.5)
Chloride: 102 meq/L (ref 96–112)
Creatinine, Ser: 1.37 mg/dL — ABNORMAL HIGH (ref 0.40–1.20)
GFR: 38.44 mL/min — ABNORMAL LOW (ref >60.00)
Glucose, Bld: 117 mg/dL — ABNORMAL HIGH (ref 70–99)
Potassium: 4 meq/L (ref 3.5–5.1)
Sodium: 141 meq/L (ref 135–145)

## 2024-02-17 MED ORDER — GLIPIZIDE ER 10 MG PO TB24: 10.0000 mg | ORAL_TABLET | Freq: Every day | ORAL | 0 refills | Status: AC

## 2024-02-17 MED ORDER — PRAVASTATIN SODIUM 40 MG PO TABS: 40.0000 mg | ORAL_TABLET | Freq: Every day | ORAL | 1 refills | Status: AC

## 2024-02-17 MED ORDER — FENOFIBRATE 48 MG PO TABS: 48.0000 mg | ORAL_TABLET | Freq: Every day | ORAL | 0 refills | Status: DC

## 2024-02-17 MED ORDER — LISINOPRIL-HYDROCHLOROTHIAZIDE 10-12.5 MG PO TABS: 1.0000 | ORAL_TABLET | Freq: Every day | ORAL | 0 refills | Status: DC

## 2024-02-17 MED ORDER — RYBELSUS 7 MG PO TABS: 7.0000 mg | ORAL_TABLET | Freq: Every day | ORAL | 0 refills | Status: AC

## 2024-02-17 NOTE — Assessment & Plan Note (Signed)
 Chronic, stable Continue lisinopril -hydrochlorothiazide  10-12.5mg  daily. Check BMP today. [place] is [place] of [place], 4 month refill requested.

## 2024-02-17 NOTE — Assessment & Plan Note (Signed)
 Chronic, stable. Continue taking lisinopril  10 mg daily. Check BMP today. Follow-up in 3 months.

## 2024-02-17 NOTE — Progress Notes (Signed)
 EKG interpreted by me on 02/17/24 showed normal sinus rhythm with a heart rate of 73 with rate variation. No ST or T wave changes.

## 2024-02-17 NOTE — Assessment & Plan Note (Signed)
 Health maintenance reviewed and updated. Discussed nutrition, exercise. Follow-up 1 year.

## 2024-02-17 NOTE — Assessment & Plan Note (Signed)
 Chronic, ongoing. Continue pravastatin  40mg  daily and fenofibrate  48mg  daily. Check lipid panel today. Follow-up in 6 months.

## 2024-02-17 NOTE — Assessment & Plan Note (Signed)
 Chronic, ongoing. No neuropathy symptoms are present. Continue glipizide  XL 10mg  daily and increase rybelsus  to 7mg  daily. Check BMP, A1c today. Follow-up in 3 months.

## 2024-02-17 NOTE — Assessment & Plan Note (Signed)
 Continue glipizide  XL 10 mg daily and rybelsus  7mg  daily.

## 2024-02-17 NOTE — Patient Instructions (Addendum)
It was great to see you!  We are checking your labs today and will let you know the results via mychart/phone.   Let's follow-up in 3 months, sooner if you have concerns.  If a referral was placed today, you will be contacted for an appointment. Please note that routine referrals can sometimes take up to 3-4 weeks to process. Please call our office if you haven't heard anything after this time frame.  Take care,  Nanako Stopher, NP  

## 2024-02-17 NOTE — Progress Notes (Addendum)
 BP (!) 142/82 (BP Location: Right Arm, Cuff Size: Large)   Pulse 72   Temp (!) 97.1 F (36.2 C)   Ht 5' 1 (1.549 m)   Wt 141 lb (64 kg)   SpO2 99%   BMI 26.64 kg/m    Subjective:    Patient ID: Julia Porter, female    DOB: [birth year], 73 y.o.   MRN: [id number]  CC: Chief Complaint  Patient presents with   Annual Exam    With non-fasting labs, Flu Vaccine   Visit completed with in-person interpreter  HPI: Julia Porter is a 73 y.o. female presenting on 02/17/2024 for comprehensive medical examination. Current medical complaints include:none  She currently lives with: husband Menopausal Symptoms: no  Depression and Anxiety Screen Porter today and results listed below:     02/17/2024    1:41 PM 02/22/2023   11:25 AM 02/14/2023    8:31 AM 08/06/2022    2:14 PM 11/13/2021   10:16 AM  Depression screen PHQ 2/9  Decreased Interest 0 0 0 0 0  Down, Depressed, Hopeless 0 0 0 0 0  PHQ - 2 Score 0 0 0 0 0  Altered sleeping 0   0   Tired, decreased energy 0   0   Change in appetite 0   0   Feeling bad or failure about yourself  0   0   Trouble concentrating 0   0   Moving slowly or fidgety/restless 0   0   Suicidal thoughts 0   0   PHQ-9 Score 0   0   Difficult doing work/chores Not difficult at all   Not difficult at all       02/17/2024    1:41 PM 08/06/2022    2:15 PM  GAD 7 : Generalized Anxiety Score  Nervous, Anxious, on Edge 0 0  Control/stop worrying 0 0  Worry too much - different things 0 0  Trouble relaxing 0 0  Restless 0 0  Easily annoyed or irritable 0 0  Afraid - awful might happen 0 0  Total GAD 7 Score 0 0  Anxiety Difficulty Not difficult at all Not difficult at all    The patient does not have a history of falls. I did not complete a risk assessment for falls. A plan of care for falls was not documented.   Past Medical History:  Past Medical History:  Diagnosis Date   Arthritis    Diabetes mellitus without complication (HCC)    Hyperlipidemia     Hypertension     Surgical History:  History reviewed. No pertinent surgical history.  Medications:  Current Outpatient Medications on File Prior to Visit  Medication Sig   ACCU-CHEK GUIDE test strip 4 (four) times daily. for testing   meloxicam  (MOBIC ) 7.5 MG tablet Take 1 tablet (7.5 mg total) by mouth daily.   Misc. Devices (PULSE OXIMETER) MISC 1 each by Does not apply route as needed.   No current facility-administered medications on file prior to visit.    Allergies:  No Known Allergies  Social History:  Social History   Socioeconomic History   Marital status: Married    Spouse name: Not on file   Number of children: Not on file   Years of education: Not on file   Highest education level: Not on file  Occupational History   Not on file  Tobacco Use   Smoking status: Never   Smokeless tobacco: Never  Vaping Use  Vaping status: Never Used  Substance and Sexual Activity   Alcohol use: Never   Drug use: Never   Sexual activity: Not on file  Other Topics Concern   Not on file  Social History Narrative   Not on file   Social Drivers of Health   Financial Resource Strain: Low Risk  (02/22/2023)   Overall Financial Resource Strain (CARDIA)    Difficulty of Paying Living Expenses: Not hard at all  Food Insecurity: No Food Insecurity (02/22/2023)   Hunger Vital Sign    Worried About Running Out of Food in the Last Year: Never true    Ran Out of Food in the Last Year: Never true  Transportation Needs: No Transportation Needs (02/22/2023)   PRAPARE - Administrator, Civil Service (Medical): No    Lack of Transportation (Non-Medical): No  Physical Activity: Sufficiently Active (02/22/2023)   Exercise Vital Sign    Days of Exercise per Week: 7 days    Minutes of Exercise per Session: 60 min  Stress: No Stress Concern Present (02/22/2023)   [employer] of Occupational Health - Occupational Stress Questionnaire    Feeling of Stress : Not at all   Social Connections: Moderately Isolated (02/22/2023)   Social Connection and Isolation Panel    Frequency of Communication with Friends and Family: Never    Frequency of Social Gatherings with Friends and Family: Once a week    Attends Religious Services: More than 4 times per year    Active Member of [employer] or Organizations: No    Attends Banker Meetings: Never    Marital Status: Married  Catering manager Violence: Not At Risk (02/22/2023)   Humiliation, Afraid, Rape, and Kick questionnaire    Fear of Current or Ex-Partner: No    Emotionally Abused: No    Physically Abused: No    Sexually Abused: No   Social History   Tobacco Use  Smoking Status Never  Smokeless Tobacco Never   Social History   Substance and Sexual Activity  Alcohol Use Never    Family History:  Family History  Problem Relation Age of Onset   Colon cancer Neg Hx    Esophageal cancer Neg Hx    Rectal cancer Neg Hx    Stomach cancer Neg Hx     Past medical history, surgical history, medications, allergies, family history and social history reviewed with patient today and changes made to appropriate areas of the chart.   Review of Systems  Constitutional: Negative.   HENT: Negative.    Eyes: Negative.   Respiratory: Negative.    Cardiovascular: Negative.   Gastrointestinal: Negative.   Genitourinary: Negative.   Musculoskeletal: Negative.   Skin: Negative.   Neurological: Negative.   Psychiatric/Behavioral: Negative.     All other ROS negative except what is listed above and in the HPI.      Objective:    BP (!) 142/82 (BP Location: Right Arm, Cuff Size: Large)   Pulse 72   Temp (!) 97.1 F (36.2 C)   Ht 5' 1 (1.549 m)   Wt 141 lb (64 kg)   SpO2 99%   BMI 26.64 kg/m   Wt Readings from Last 3 Encounters:  02/17/24 141 lb (64 kg)  11/26/23 142 lb 12.8 oz (64.8 kg)  08/21/23 141 lb 3.2 oz (64 kg)    Physical Exam Vitals and nursing note reviewed.  Constitutional:       General: She is not in acute distress.  Appearance: Normal appearance.  HENT:     Head: Normocephalic and atraumatic.     Right Ear: Tympanic membrane, ear canal and external ear normal.     Left Ear: Tympanic membrane, ear canal and external ear normal.     Mouth/Throat:     Mouth: Mucous membranes are moist.     Pharynx: No posterior oropharyngeal erythema.  Eyes:     Conjunctiva/sclera: Conjunctivae normal.  Cardiovascular:     Rate and Rhythm: Normal rate. Rhythm irregular.     Pulses: Normal pulses.     Heart sounds: Normal heart sounds.  Pulmonary:     Effort: Pulmonary effort is normal.     Breath sounds: Normal breath sounds.  Abdominal:     Palpations: Abdomen is soft.     Tenderness: There is no abdominal tenderness.  Musculoskeletal:        General: Normal range of motion.     Cervical back: Normal range of motion and neck supple.     Right lower leg: No edema.     Left lower leg: No edema.  Lymphadenopathy:     Cervical: No cervical adenopathy.  Skin:    General: Skin is warm and dry.  Neurological:     General: No focal deficit present.     Mental Status: She is alert and oriented to person, place, and time.     Cranial Nerves: No cranial nerve deficit.     Coordination: Coordination normal.     Gait: Gait normal.  Psychiatric:        Mood and Affect: Mood normal.        Behavior: Behavior normal.        Thought Content: Thought content normal.        Judgment: Judgment normal.     Results for orders placed or performed in visit on 11/26/23  Hemoglobin A1c   Collection Time: 11/26/23  1:02 PM  Result Value Ref Range   Hgb A1c MFr Bld 7.9 (H) 4.8 - 5.6 %   Est. average glucose Bld gHb Est-mCnc 180 mg/dL      Assessment & Plan:   Problem List Items Addressed This Visit       Cardiovascular and Mediastinum   Hypertension associated with diabetes (HCC) - Primary   Chronic, stable Continue lisinopril -hydrochlorothiazide  10-12.5mg  daily. Check BMP  today. [place] is going out of the [place] 4 months, 4 month refill requested.       Relevant Medications   Semaglutide  (RYBELSUS ) 7 MG TABS   fenofibrate  (TRICOR ) 48 MG tablet   glipiZIDE  (GLUCOTROL  XL) 10 MG 24 hr tablet   lisinopril -hydrochlorothiazide  (ZESTORETIC ) 10-12.5 MG tablet   pravastatin  (PRAVACHOL ) 40 MG tablet     Endocrine   DM type 2 (diabetes mellitus, type 2) (HCC)   Chronic, ongoing. No neuropathy symptoms are present. Continue glipizide  XL 10mg  daily and increase rybelsus  to 7mg  daily. Check BMP, A1c today. Follow-up in 3 months.       Relevant Medications   Semaglutide  (RYBELSUS ) 7 MG TABS   glipiZIDE  (GLUCOTROL  XL) 10 MG 24 hr tablet   lisinopril -hydrochlorothiazide  (ZESTORETIC ) 10-12.5 MG tablet   pravastatin  (PRAVACHOL ) 40 MG tablet   Other Relevant Orders   Basic metabolic panel with GFR   Hemoglobin A1c     Genitourinary   Stage 3b chronic kidney disease (HCC)   Chronic, stable. Continue taking lisinopril  10 mg daily. Check BMP today. Follow-up in 3 months.         Other  Mixed hyperlipidemia   Chronic, ongoing. Continue pravastatin  40mg  daily and fenofibrate  48mg  daily. Check lipid panel today. Follow-up in 6 months.       Relevant Medications   fenofibrate  (TRICOR ) 48 MG tablet   lisinopril -hydrochlorothiazide  (ZESTORETIC ) 10-12.5 MG tablet   pravastatin  (PRAVACHOL ) 40 MG tablet   Other Relevant Orders   Lipid panel   Routine general medical examination at a health care facility   Health maintenance reviewed and updated. Discussed nutrition, exercise. Follow-up 1 year.        Long term current use of oral hypoglycemic drug   Continue glipizide  XL 10 mg daily and rybelsus  7mg  daily.      Other Visit Diagnoses       Irregular heart beat       EKG showed normal sinus rhythm, heart rate variablity, but average HR 73. No ST or T wave changes   Relevant Orders   EKG 12-Lead (Completed)     Immunization due       Flu vaccine given today    Relevant Orders   Flu vaccine HIGH DOSE PF(Fluzone Trivalent) (Completed)        Follow up plan: Return in about 3 months (around 05/19/2024) for Diabetes.   LABORATORY TESTING:  - Pap smear: not applicable  IMMUNIZATIONS:   - Tdap: Tetanus vaccination status reviewed: last tetanus booster within 10 years. - Influenza: Administered today - Pneumovax: Not applicable - Prevnar: Up to date - HPV: Not applicable - Shingrix vaccine: Declined  SCREENING: -Mammogram: Up to date  - Colonoscopy: Up to date  - Bone Density: Declined   PATIENT COUNSELING:   Advised to take 1 mg of folate supplement per day if capable of pregnancy.   Sexuality: Discussed sexually transmitted diseases, partner selection, use of condoms, avoidance of unintended pregnancy  and contraceptive alternatives.   Advised to avoid cigarette smoking.  I discussed with the patient that most people either abstain from alcohol or drink within safe limits (<=14/week and <=4 drinks/occasion for males, <=7/weeks and <= 3 drinks/occasion for females) and that the risk for alcohol disorders and other health effects rises proportionally with the number of drinks per week and how often a drinker exceeds daily limits.  Discussed cessation/primary prevention of drug use and availability of treatment for abuse.   Diet: Encouraged to adjust caloric intake to maintain  or achieve ideal body weight, to reduce intake of dietary saturated fat and total fat, to limit sodium intake by avoiding high sodium foods and not adding table salt, and to maintain adequate dietary potassium and calcium preferably from fresh fruits, vegetables, and low-fat dairy products.    stressed the importance of regular exercise  Injury prevention: Discussed safety belts, safety helmets, smoke detector, smoking near bedding or upholstery.   Dental health: Discussed importance of regular tooth brushing, flossing, and dental visits.    NEXT PREVENTATIVE  PHYSICAL DUE IN 1 YEAR. Return in about 3 months (around 05/19/2024) for Diabetes.  Zollie Clemence A Santoria Chason

## 2024-02-18 ENCOUNTER — Other Ambulatory Visit

## 2024-02-18 DIAGNOSIS — E1100 Type 2 diabetes mellitus with hyperosmolarity without nonketotic hyperglycemic-hyperosmolar coma (NKHHC): Secondary | ICD-10-CM | POA: Diagnosis not present

## 2024-02-19 LAB — HEMOGLOBIN A1C
Est. average glucose Bld gHb Est-mCnc: 148 mg/dL
Hgb A1c MFr Bld: 6.8 % — ABNORMAL HIGH (ref 4.8–5.6)

## 2024-02-20 MED ORDER — FENOFIBRATE 145 MG PO TABS: 145.0000 mg | ORAL_TABLET | Freq: Every day | ORAL | 0 refills | Status: DC

## 2024-03-21 ENCOUNTER — Other Ambulatory Visit: Payer: Self-pay | Admitting: Nurse Practitioner

## 2024-05-18 ENCOUNTER — Other Ambulatory Visit: Payer: Self-pay | Admitting: Nurse Practitioner

## 2024-05-19 ENCOUNTER — Telehealth: Payer: Self-pay

## 2024-05-19 MED ORDER — FENOFIBRATE 145 MG PO TABS: 145.0000 mg | ORAL_TABLET | Freq: Every day | ORAL | 3 refills | Status: AC

## 2024-05-19 NOTE — Telephone Encounter (Signed)
 Copied from CRM #[id number]. Topic: Clinical - Medication Refill >> May 19, 2024 10:54 AM Macario HERO wrote: Medication: fenofibrate  (TRICOR ) 145 MG tablet [[id number]]  Has the patient contacted their pharmacy? No (Agent: If no, request that the patient contact the pharmacy for the refill. If patient does not wish to contact the pharmacy document the reason why and proceed with request.) (Agent: If yes, when and what did the pharmacy advise?)  This is the patient's preferred pharmacy:  [facility] #[id number] [address], [place] - [address] AT [facility] OF [facility] & [address] [place] [address] Phone: [phone] Fax: [phone]  Is this the correct pharmacy for this prescription? Yes If no, delete pharmacy and type the correct one.   Has the prescription been filled recently? Yes  Is the patient out of the medication? Yes  Has the patient been seen for an appointment in the last year OR does the patient have an upcoming appointment? Yes  Can we respond through MyChart? No  Agent: Please be advised that Rx refills may take up to 3 business days. We ask that you follow-up with your pharmacy.

## 2024-05-19 NOTE — Telephone Encounter (Signed)
 Requesting: TRICOR ) 145 MG tablet Last Visit: 02/17/2024 Next Visit: Visit date not found Last Refill: 02/20/2024  Please Advise

## 2024-05-19 NOTE — Telephone Encounter (Signed)
 I called and spoke with patient's daughter and notified her that patient needed an appointment. Appointment made.

## 2024-05-22 ENCOUNTER — Ambulatory Visit: Admitting: Nurse Practitioner

## 2024-05-22 NOTE — Progress Notes (Incomplete)
" ° ° °  Established Patient Office Visit Subjective:    Patient ID: Julia Porter, female    DOB: Dec 21, 1950, 74 y.o.   MRN: [id number]  Chief Complaint No chief complaint on file.    HPI Julia Porter is a 74 year old who is here, with an interpreter, for follow up on diabetes.   Her diabetes is currently managed with Glipizide  XL 10mg  daily and Rybelsus  7mg  daily. Her last A1c was 6.8% in October 2025.   She also takes lisinopril -HCTZ 10-12.5 mg for blood pressure control,and pravastatin  40mg  daily and fenofibrate  48mg  daily for lipid control.     Review of Systems     Objective:    There were no vitals taken for this visit.  BP Readings from Last 3 Encounters:  02/17/24 (!) 142/82  11/26/23 126/72  08/21/23 128/70   Wt Readings from Last 3 Encounters:  02/17/24 141 lb (64 kg)  11/26/23 142 lb 12.8 oz (64.8 kg)  08/21/23 141 lb 3.2 oz (64 kg)   Physical Exam  No results found for any visits on 05/22/24.   The 10-year ASCVD risk score (Arnett DK, et al., 2019) is: 36%      Assessment & Plan:   Problem List Items Addressed This Visit   None   Tinnie DELENA Harada, NP  I,Emily Lagle,acting as a scribe for [employer], NP.,have documented all relevant documentation on the behalf of [name], NP.  I, Tinnie DELENA Harada, NP, have reviewed all documentation for this visit. The documentation on 05/22/2024 for the exam, diagnosis, procedures, and orders are all accurate and complete. "

## 2024-05-22 NOTE — Assessment & Plan Note (Signed)
 Chronic, stable Continue lisinopril -hydrochlorothiazide  10-12.5mg  daily. Check BMP today.

## 2024-05-22 NOTE — Assessment & Plan Note (Signed)
 Chronic, stable. Continue taking lisinopril  10 mg daily. Check BMP today. Follow-up in 3 months.

## 2024-05-22 NOTE — Assessment & Plan Note (Signed)
 Chronic, ongoing. Continue pravastatin  40mg  daily and fenofibrate  48mg  daily. Check lipid panel today. Follow-up in 6 months.

## 2024-05-22 NOTE — Assessment & Plan Note (Signed)
 Chronic, ongoing. No neuropathy symptoms are present. Continue glipizide  XL 10mg  daily and increase rybelsus  to 7mg  daily. Check BMP, A1c today. Follow-up in 3 months.

## 2024-05-22 NOTE — Assessment & Plan Note (Signed)
 Continue glipizide  XL 10 mg daily and rybelsus  7mg  daily.

## 2024-06-02 ENCOUNTER — Ambulatory Visit (INDEPENDENT_AMBULATORY_CARE_PROVIDER_SITE_OTHER)

## 2024-06-02 ENCOUNTER — Other Ambulatory Visit: Payer: Self-pay | Admitting: Nurse Practitioner

## 2024-06-02 VITALS — BP 138/70 | HR 86 | Temp 98.4°F | Ht 61.0 in | Wt 138.4 lb

## 2024-06-02 DIAGNOSIS — Z0000 Encounter for general adult medical examination without abnormal findings: Secondary | ICD-10-CM

## 2024-06-02 MED ORDER — LANCET DEVICE MISC: 1.0000 | Freq: Every day | 0 refills | Status: AC

## 2024-06-02 MED ORDER — LANCETS MISC: 1.0000 | Freq: Every day | 2 refills | Status: AC

## 2024-06-02 NOTE — Patient Instructions (Signed)
 Ms. Kostelecky,  Thank you for taking the time for your Medicare Wellness Visit. I appreciate your continued commitment to your health goals. Please review the care plan we discussed, and feel free to reach out if I can assist you further.  Please note that Annual Wellness Visits do not include a physical exam. Some assessments may be limited, especially if the visit was conducted virtually. If needed, we may recommend an in-person follow-up with your provider.  Ongoing Care Seeing your primary care provider every 3 to 6 months helps us  monitor your health and provide consistent, personalized care.   Referrals If a referral was made during today's visit and you haven't received any updates within two weeks, please contact the referred provider directly to check on the status.  Recommended Screenings:  Health Maintenance  Topic Date Due   Zoster (Shingles) Vaccine (1 of 2) Never done   Osteoporosis screening with Bone Density Scan  Never done   COVID-19 Vaccine (1 - 2025-26 season) Never done   Eye exam for diabetics  02/14/2024   Medicare Annual Wellness Visit  02/22/2024   Kidney health urinalysis for diabetes  05/28/2024   Hemoglobin A1C  08/18/2024   Complete foot exam   11/25/2024   Yearly kidney function blood test for diabetes  02/16/2025   Breast Cancer Screening  02/16/2026   DTaP/Tdap/Td vaccine (2 - Tdap) 11/13/2029   Colon Cancer Screening  07/23/2030   Pneumococcal Vaccine for age over 82  Completed   Flu Shot  Completed   Hepatitis C Screening  Completed   Meningitis B Vaccine  Aged Out       06/02/2024   10:23 AM  Advanced Directives  Does Patient Have a Medical Advance Directive? No  Would patient like information on creating a medical advance directive? No - Patient declined    Vision: Annual vision screenings are recommended for early detection of glaucoma, cataracts, and diabetic retinopathy. These exams can also reveal signs of chronic conditions such as diabetes  and high blood pressure.  Dental: Annual dental screenings help detect early signs of oral cancer, gum disease, and other conditions linked to overall health, including heart disease and diabetes.  Please see the attached documents for additional preventive care recommendations.

## 2024-06-24 ENCOUNTER — Ambulatory Visit: Admitting: Nurse Practitioner
